# Patient Record
Sex: Female | Born: 1935 | Race: White | Hispanic: No | Marital: Married | Smoking: Never smoker
Health system: Southern US, Community
[De-identification: ages and names within clinical notes are randomized; demographics above are authoritative.]

## PROBLEM LIST (undated history)

## (undated) DIAGNOSIS — Z853 Personal history of malignant neoplasm of breast: Secondary | ICD-10-CM

## (undated) DIAGNOSIS — M1990 Unspecified osteoarthritis, unspecified site: Secondary | ICD-10-CM

## (undated) DIAGNOSIS — Z923 Personal history of irradiation: Secondary | ICD-10-CM

## (undated) DIAGNOSIS — C50919 Malignant neoplasm of unspecified site of unspecified female breast: Secondary | ICD-10-CM

## (undated) DIAGNOSIS — K529 Noninfective gastroenteritis and colitis, unspecified: Secondary | ICD-10-CM

## (undated) DIAGNOSIS — I10 Essential (primary) hypertension: Secondary | ICD-10-CM

## (undated) HISTORY — DX: Unspecified osteoarthritis, unspecified site: M19.90

## (undated) HISTORY — DX: Personal history of malignant neoplasm of breast: Z85.3

## (undated) HISTORY — DX: Essential (primary) hypertension: I10

## (undated) HISTORY — DX: Malignant neoplasm of unspecified site of unspecified female breast: C50.919

## (undated) HISTORY — PX: TONSILLECTOMY: SUR1361

## (undated) HISTORY — DX: Noninfective gastroenteritis and colitis, unspecified: K52.9

## (undated) HISTORY — PX: EYE SURGERY: SHX253

---

## 1999-01-11 DIAGNOSIS — C50919 Malignant neoplasm of unspecified site of unspecified female breast: Secondary | ICD-10-CM

## 1999-01-11 HISTORY — DX: Malignant neoplasm of unspecified site of unspecified female breast: C50.919

## 1999-01-11 HISTORY — PX: BREAST LUMPECTOMY: SHX2

## 1999-01-11 HISTORY — PX: BREAST EXCISIONAL BIOPSY: SUR124

## 1999-01-11 HISTORY — PX: BREAST BIOPSY: SHX20

## 2003-10-11 ENCOUNTER — Ambulatory Visit: Payer: Self-pay | Admitting: Oncology

## 2003-11-16 ENCOUNTER — Other Ambulatory Visit: Payer: Self-pay

## 2003-11-16 ENCOUNTER — Emergency Department: Payer: Self-pay | Admitting: Emergency Medicine

## 2004-04-02 ENCOUNTER — Ambulatory Visit: Payer: Self-pay | Admitting: Oncology

## 2004-04-10 ENCOUNTER — Ambulatory Visit: Payer: Self-pay | Admitting: Oncology

## 2004-04-16 ENCOUNTER — Ambulatory Visit: Payer: Self-pay | Admitting: General Surgery

## 2004-09-28 ENCOUNTER — Ambulatory Visit: Payer: Self-pay | Admitting: Unknown Physician Specialty

## 2004-10-14 ENCOUNTER — Ambulatory Visit: Payer: Self-pay | Admitting: Oncology

## 2004-11-10 ENCOUNTER — Ambulatory Visit: Payer: Self-pay | Admitting: Oncology

## 2005-02-14 ENCOUNTER — Ambulatory Visit: Payer: Self-pay | Admitting: Unknown Physician Specialty

## 2005-02-14 ENCOUNTER — Other Ambulatory Visit: Payer: Self-pay

## 2005-04-11 ENCOUNTER — Ambulatory Visit: Payer: Self-pay | Admitting: Oncology

## 2005-05-11 ENCOUNTER — Ambulatory Visit: Payer: Self-pay | Admitting: General Surgery

## 2005-05-12 ENCOUNTER — Ambulatory Visit: Payer: Self-pay | Admitting: Oncology

## 2005-10-24 ENCOUNTER — Ambulatory Visit: Payer: Self-pay | Admitting: Oncology

## 2005-11-10 ENCOUNTER — Ambulatory Visit: Payer: Self-pay | Admitting: Oncology

## 2005-12-15 ENCOUNTER — Ambulatory Visit: Payer: Self-pay | Admitting: General Practice

## 2006-06-12 ENCOUNTER — Ambulatory Visit: Payer: Self-pay | Admitting: General Surgery

## 2006-10-11 ENCOUNTER — Ambulatory Visit: Payer: Self-pay | Admitting: Oncology

## 2006-10-19 ENCOUNTER — Ambulatory Visit: Payer: Self-pay | Admitting: Oncology

## 2006-11-11 ENCOUNTER — Ambulatory Visit: Payer: Self-pay | Admitting: Oncology

## 2007-06-11 ENCOUNTER — Ambulatory Visit: Payer: Self-pay | Admitting: Oncology

## 2007-06-13 ENCOUNTER — Ambulatory Visit: Payer: Self-pay | Admitting: General Surgery

## 2007-11-11 ENCOUNTER — Ambulatory Visit: Payer: Self-pay | Admitting: Oncology

## 2007-11-14 ENCOUNTER — Ambulatory Visit: Payer: Self-pay | Admitting: Oncology

## 2007-12-11 ENCOUNTER — Ambulatory Visit: Payer: Self-pay | Admitting: Oncology

## 2008-01-11 HISTORY — PX: BREAST BIOPSY: SHX20

## 2008-06-24 ENCOUNTER — Ambulatory Visit: Payer: Self-pay | Admitting: General Surgery

## 2008-10-07 ENCOUNTER — Ambulatory Visit: Payer: Self-pay | Admitting: Ophthalmology

## 2008-10-13 ENCOUNTER — Ambulatory Visit: Payer: Self-pay | Admitting: Ophthalmology

## 2008-11-10 ENCOUNTER — Ambulatory Visit: Payer: Self-pay | Admitting: Oncology

## 2008-11-19 ENCOUNTER — Ambulatory Visit: Payer: Self-pay | Admitting: Oncology

## 2008-12-10 ENCOUNTER — Ambulatory Visit: Payer: Self-pay | Admitting: Oncology

## 2009-04-08 ENCOUNTER — Ambulatory Visit: Payer: Self-pay | Admitting: Unknown Physician Specialty

## 2009-04-15 ENCOUNTER — Ambulatory Visit: Payer: Self-pay | Admitting: Unknown Physician Specialty

## 2009-06-30 ENCOUNTER — Ambulatory Visit: Payer: Self-pay | Admitting: General Surgery

## 2010-01-10 HISTORY — PX: COLONOSCOPY: SHX174

## 2010-04-06 ENCOUNTER — Ambulatory Visit: Payer: Self-pay | Admitting: Unknown Physician Specialty

## 2010-07-12 ENCOUNTER — Ambulatory Visit: Payer: Self-pay | Admitting: General Surgery

## 2011-07-19 ENCOUNTER — Ambulatory Visit: Payer: Self-pay | Admitting: General Surgery

## 2011-09-04 ENCOUNTER — Observation Stay: Payer: Self-pay | Admitting: Internal Medicine

## 2011-09-04 LAB — COMPREHENSIVE METABOLIC PANEL
Albumin: 2.5 g/dL — ABNORMAL LOW (ref 3.4–5.0)
Alkaline Phosphatase: 55 U/L (ref 50–136)
Calcium, Total: 6.1 mg/dL — CL (ref 8.5–10.1)
Co2: 20 mmol/L — ABNORMAL LOW (ref 21–32)
EGFR (Non-African Amer.): >60
Glucose: 92 mg/dL (ref 65–99)
Osmolality: 299 (ref 275–301)
SGOT(AST): 14 U/L — ABNORMAL LOW (ref 15–37)
SGPT (ALT): 16 U/L (ref 12–78)

## 2011-09-04 LAB — URINALYSIS, COMPLETE
Bilirubin,UR: NEGATIVE
Blood: NEGATIVE
Glucose,UR: NEGATIVE mg/dL (ref 0–75)
Nitrite: POSITIVE
Ph: 5 (ref 4.5–8.0)
Specific Gravity: 1.01 (ref 1.003–1.030)
Squamous Epithelial: 3
Transitional Epi: 1
WBC UR: 24 /HPF (ref 0–5)

## 2011-09-04 LAB — CBC
Platelet: 152 10*3/uL (ref 150–440)
RBC: 3.18 10*6/uL — ABNORMAL LOW (ref 3.80–5.20)
RDW: 13.9 % (ref 11.5–14.5)

## 2011-09-04 LAB — CK TOTAL AND CKMB (NOT AT ARMC)
CK, Total: 34 U/L (ref 21–215)
CK-MB: 0.7 ng/mL (ref 0.5–3.6)

## 2011-09-04 LAB — TROPONIN I: Troponin-I: <0.02 ng/mL

## 2011-09-05 LAB — BASIC METABOLIC PANEL
BUN: 19 mg/dL — ABNORMAL HIGH (ref 7–18)
Chloride: 110 mmol/L — ABNORMAL HIGH (ref 98–107)
Co2: 23 mmol/L (ref 21–32)
Creatinine: 0.81 mg/dL (ref 0.60–1.30)
Potassium: 4.6 mmol/L (ref 3.5–5.1)

## 2012-07-23 ENCOUNTER — Ambulatory Visit: Payer: Self-pay | Admitting: General Surgery

## 2012-07-23 ENCOUNTER — Encounter: Payer: Self-pay | Admitting: General Surgery

## 2012-08-09 ENCOUNTER — Ambulatory Visit (INDEPENDENT_AMBULATORY_CARE_PROVIDER_SITE_OTHER): Payer: Medicare PPO | Admitting: General Surgery

## 2012-08-09 ENCOUNTER — Encounter: Payer: Self-pay | Admitting: General Surgery

## 2012-08-09 VITALS — BP 122/68 | HR 68 | Resp 13 | Ht 66.0 in | Wt 184.0 lb

## 2012-08-09 DIAGNOSIS — Z853 Personal history of malignant neoplasm of breast: Secondary | ICD-10-CM

## 2012-08-09 NOTE — Progress Notes (Signed)
Patient ID: Olivia [name], female   DOB: July 25, 1931, 79 y.o.   MRN: [id number]  Chief Complaint  Patient presents with  . Follow-up    mammogram    HPI Olivia [name] is a 79 y.o. female  Here for follow up bilateral diagnostic mammogram done at [facility]  On 07/23/12. Patient has history of left breast cancer that was treated with lumpectomy and radiation in 2001. She completed 5 years of Arimidex.  Patient does preform self breast exams and does get routine mammograms done. No new breast issues. HPI  Past Medical History  Diagnosis Date  . Hypertension   . Personal history of malignant neoplasm of breast   . Breast cancer 2001    left, invasive ductal carcinoma  . Arthritis     Past Surgical History  Procedure Laterality Date  . Colonoscopy  2012    Dr. Mechele Collin  . Tonsillectomy  as child  . Eye surgery  2006, 2010    cataract  . Breast lumpectomy Left 2001    Family History  Problem Relation Age of Onset  . Diabetes Father   . Cancer Mother     Social History History  Substance Use Topics  . Smoking status: Never Smoker   . Smokeless tobacco: Never Used  . Alcohol Use: No    No Known Allergies  Current Outpatient Prescriptions  Medication Sig Dispense Refill  . acetaminophen (TYLENOL) 500 MG tablet Take 500 mg by mouth every 6 (six) hours as needed for pain.      Marland Kitchen atorvastatin (LIPITOR) 10 MG tablet Take 10 mg by mouth daily.      . Calcium Carbonate-Vitamin D (CALCIUM + D PO) Take 1 tablet by mouth 2 (two) times daily.      . furosemide (LASIX) 20 MG tablet Take 20 mg by mouth as needed.      . ramipril (ALTACE) 5 MG capsule Take 5 mg by mouth daily.       No current facility-administered medications for this visit.    Review of Systems Review of Systems  Constitutional: Negative.   Respiratory: Negative.   Cardiovascular: Negative.     Blood pressure 122/68, pulse 68, resp. rate 13, height 5\' 6"  (1.676 m), weight 184 lb (83.462 kg).  Physical  Exam Physical Exam  Constitutional: She is oriented to person, place, and time. She appears well-developed and well-nourished.  Eyes: Conjunctivae are normal.  Neck: Neck supple. No thyromegaly present.  Cardiovascular: Normal rate and regular rhythm.   Pulses:      Dorsalis pedis pulses are 2+ on the right side, and 2+ on the left side.       Posterior tibial pulses are 2+ on the right side, and 2+ on the left side.  Pulmonary/Chest: Effort normal and breath sounds normal. Right breast exhibits no inverted nipple, no mass, no nipple discharge, no skin change and no tenderness. Left breast exhibits no inverted nipple, no mass, no nipple discharge, no skin change and no tenderness.  Abdominal: Soft. There is no hepatosplenomegaly. There is no tenderness.  Musculoskeletal: She exhibits no edema.  Lymphadenopathy:    She has no cervical adenopathy.    She has no axillary adenopathy.  Neurological: She is alert and oriented to person, place, and time.  Skin: Skin is warm and dry.  Well healed lumpectomy site upper outer quadrant left breast  Data Reviewed Mammogram reviewed and stable  Assessment    Stable exam    Plan  One year bilateral diagnostic mammogram CA 27.29 blood test can be drawn with routine labs from Dr Bertha Stakes, [place] 08/09/2012, 9:45 AM

## 2012-08-09 NOTE — Patient Instructions (Addendum)
Continue self breast exams. Call office for any new breast issues or concerns. 

## 2013-08-14 ENCOUNTER — Ambulatory Visit: Payer: Self-pay | Admitting: General Surgery

## 2013-08-14 ENCOUNTER — Encounter: Payer: Self-pay | Admitting: General Surgery

## 2013-08-22 ENCOUNTER — Ambulatory Visit: Payer: Medicare PPO | Admitting: General Surgery

## 2013-08-26 ENCOUNTER — Ambulatory Visit (INDEPENDENT_AMBULATORY_CARE_PROVIDER_SITE_OTHER): Payer: Medicare PPO | Admitting: General Surgery

## 2013-08-26 ENCOUNTER — Encounter: Payer: Self-pay | Admitting: General Surgery

## 2013-08-26 VITALS — BP 130/74 | HR 70 | Resp 14 | Ht 66.0 in | Wt 179.0 lb

## 2013-08-26 DIAGNOSIS — Z853 Personal history of malignant neoplasm of breast: Secondary | ICD-10-CM

## 2013-08-26 NOTE — Progress Notes (Signed)
Patient ID: MAIGEN [name], female   DOB: 1931-11-16, 80 y.o.   MRN: [id number]  Chief Complaint  Patient presents with  . Follow-up    mammogram    HPI Olivia [name] is a 80 y.o. female who presents for a breast cancer follow up. The most recent mammogram was done on 08/14/13 . Patient does perform regular self breast checks and gets regular mammograms done.Patient has history of left breast cancer that was treated with lumpectomy and radiation in 2001.    HPI  Past Medical History  Diagnosis Date  . Hypertension   . Personal history of malignant neoplasm of breast   . Arthritis   . Breast cancer 2001    left, invasive ductal carcinoma T1 N0 ER/PR positive    Past Surgical History  Procedure Laterality Date  . Colonoscopy  2012    Dr. Vira Agar  . Tonsillectomy  as child  . Eye surgery  2006, 2010    cataract  . Breast lumpectomy Left 2001    Family History  Problem Relation Age of Onset  . Diabetes Father   . Cancer Mother     Social History History  Substance Use Topics  . Smoking status: Never Smoker   . Smokeless tobacco: Never Used  . Alcohol Use: No    No Known Allergies  Current Outpatient Prescriptions  Medication Sig Dispense Refill  . acetaminophen (TYLENOL) 500 MG tablet Take 500 mg by mouth every 6 (six) hours as needed for pain.      Marland Kitchen atorvastatin (LIPITOR) 10 MG tablet Take 10 mg by mouth daily.      . Calcium Carbonate-Vitamin D (CALCIUM + D PO) Take 1 tablet by mouth 2 (two) times daily.      . furosemide (LASIX) 20 MG tablet Take 20 mg by mouth as needed.      . ramipril (ALTACE) 5 MG capsule Take 5 mg by mouth daily.       No current facility-administered medications for this visit.    Review of Systems Review of Systems  Constitutional: Negative.   Respiratory: Negative.   Cardiovascular: Negative.     Blood pressure 130/74, pulse 70, resp. rate 14, height 5\' 6"  (1.676 m), weight 179 lb (81.194 kg).  Physical Exam Physical  Exam  Constitutional: She is oriented to person, place, and time. She appears well-developed and well-nourished.  Eyes: Conjunctivae are normal. No scleral icterus.  Neck: Neck supple.  Cardiovascular: Normal rate, regular rhythm and normal heart sounds.   Pulmonary/Chest: Effort normal and breath sounds normal. Right breast exhibits no inverted nipple, no mass, no nipple discharge, no skin change and no tenderness. Left breast exhibits no inverted nipple, no mass, no nipple discharge, no skin change and no tenderness.  Abdominal: Soft. There is no tenderness.  Lymphadenopathy:    She has no cervical adenopathy.    She has no axillary adenopathy.  Neurological: She is alert and oriented to person, place, and time.    Data Reviewed Mammogram reviewed-stable  Assessment  Stable exam. Pt is 14 yrs post left breast lumpectomy and radiation.     Plan   Patient will be asked to return to the office in one year with a bilateral screening mammogram. CA 27-29 being monitored along with her regular labs.        Loise Esguerra G 08/28/2013, 6:21 AM

## 2013-08-26 NOTE — Patient Instructions (Signed)
Continue self breast exams. Call office for any new breast issues or concerns. 

## 2013-08-28 ENCOUNTER — Encounter: Payer: Self-pay | Admitting: General Surgery

## 2013-11-11 ENCOUNTER — Encounter: Payer: Self-pay | Admitting: General Surgery

## 2014-02-28 ENCOUNTER — Inpatient Hospital Stay: Payer: Self-pay | Admitting: Internal Medicine

## 2014-03-11 DIAGNOSIS — K529 Noninfective gastroenteritis and colitis, unspecified: Secondary | ICD-10-CM

## 2014-03-11 HISTORY — DX: Noninfective gastroenteritis and colitis, unspecified: K52.9

## 2014-04-29 NOTE — Discharge Summary (Signed)
PATIENT NAME:  Olivia [name], Olivia [name] MR#:  [id number] DATE OF BIRTH:  [birth year]  DATE OF ADMISSION:  09/04/2011 DATE OF DISCHARGE:  09/05/2011  DISCHARGE DIAGNOSIS: Vasovagal syncope.  HISTORY AND PRESENTATION: This 81 year old female with history of hypertension, chronic constipation since childhood, and history of allergies came to the emergency room after having a syncopal episode. As per her, she was constipated last this morning; she has been constipated for the last several days more than usual and she took two Bisacodyl tablets in the morning and she took her dog out for a walk. She started feeling unwell and desire to go to the bathroom. So she came home, suddenly started feeling very weak and clammy. She sat down. She had an urge to have a bowel movement so she walked towards the bathroom, but before she reached the bathroom she had a syncopal episode. Her husband as there. He held her and so she did not have any trauma, but as per him she was not very responsive for 2 to 3 minutes and not answering his questions. So she was brought to the emergency room. In the ER, the patient was found to have electrolyte abnormalities and hypernatremia, hyperchloremia, and low potassium. She was admitted for dehydration and electrolyte imbalance.   HOSPITAL COURSE:  1. During the hospital stay, the patient was given IV fluid and she had three bowel movements in the hospital. She was rehydrated well and repeated electrolyte level was fine. She remained without any further episode of syncope, feeling any palpitations, or any chest pain. So the diagnosis was most likely vasovagal which was reemphasized and she was discharged in the evening as she was perfectly feeling normal. Her dehydration and electrolyte imbalance was corrected after replacement. Hypokalemia was thought maybe possibly due to diuretics and diarrhea, but it resolved after replacement. She was also using Lasix for her ankle swelling and that might  have contributed to hypokalemia and dehydration, but we advised her not to use Lasix for ankle swelling; instead of that use elastic stockings.  2. Hypertension. We advised her to continue Altace. 3. WBCs in the urine were found during the workup, but she did not have any symptoms so we did not give any treatment for that.   DISCHARGE CONDITION: Good.  CODE STATUS: FULL CODE.   DISCHARGE MEDICATIONS: 1. Altace 5 mg one capsule once a day. 2. Calcium 600 mg orally twice a day. 3. Centrum Silver one tablet once a day. 4. Nasonex 50 mcg inhalation spray in each nostril as needed for allergies and sinus. 5. Aspirin, enteric-coated, 81 mg once a day. 6. Tylenol Arthritis 650 mg oral tablet once a day as needed.  NOTE: We advised her to stop taking furosemide.  OTHER INSTRUCTIONS ON DISCHARGE: Home oxygen - none. Home health - none. Diet - low fat, low cholesterol. Diet consistent - regular. Activity limitations - none. Return to work in one week. Time frame for followup - one to two weeks with Dr. Benita Stabile who is her primary care physician.  ____________________________ Ceasar Lund. Anselm Jungling, MD vgv:slb D: 09/09/2011 16:50:57 ET T: 09/10/2011 14:33:13 ET JOB#: [id number]  cc: Ceasar Lund. Anselm Jungling, MD, <Dictator> Leona Carry. Hall Busing, MD Vaughan Basta MD ELECTRONICALLY SIGNED 09/28/2011 14:36

## 2014-04-29 NOTE — H&P (Signed)
PATIENT NAME:  Olivia [name], Olivia [name] MR#:  [id number] DATE OF BIRTH:  Dec 18, 1931  DATE OF ADMISSION:  09/04/2011  PRIMARY CARE PHYSICIAN:  Dr. Benita Stabile    CHIEF COMPLAINT: Syncopal episode.   HISTORY OF PRESENT ILLNESS:  Olivia [name] is a pleasant 81 year old Caucasian female who has a history of hypertension, chronic constipation since childhood, and history of allergies who comes to the Emergency Room after she had a syncopal episode. Per the patient she was constipated last this morning; she has been constipated for the last several days more than usual and took two bisacodyl tablets this morning. She took her dog out for a walk, started feeling "unwell" and decided to return home. She came home and all of a sudden started feeling very weak, became clammy and sat down.  She had the urge to have a bowel movement. By the time she could reach the bathroom with the help of her husband, she had an accident and thereafter had a syncopal episode. She did not have any trauma and did not fall since her husband was holding her. The patient felt very weak and then had a few more bowel movements thereafter. She was brought to the Emergency Room by her family members.  In the ER the patient was found to have electrolyte abnormalities with hypernatremia, hyperchloremia, and low potassium. She is being admitted for dehydration and electrolyte imbalances along with symptoms of syncope, suspected vasovagal.   PAST MEDICAL HISTORY:  1. Arthritis.  2. Hypertension.  3. Hypercholesterolemia.  4. Breast cancer status post lumpectomy.   MEDICATIONS:  1. Altace 5 mg daily.  2. Lasix 10 mg daily for ankle edema.  3. Calcium with vitamin D 600 b.i.d.  4. Aspirin 81 mg daily.  5. Allegra 180 mg daily p.r.n. 6. Aleve 1 tablet daily.   FAMILY HISTORY: Positive for hypertension.   SOCIAL HISTORY: Married, lives with her husband at home. Nonalcoholic. Nonsmoker.   REVIEW OF SYSTEMS:  CONSTITUTIONAL: No fever.  Positive for fatigue, weakness. EYES: No blurred or double vision or glaucoma. ENT: No tinnitus, ear pain, or hearing loss.  RESPIRATORY: No cough, wheeze, hemoptysis, or chronic obstructive pulmonary disease.  CARDIOVASCULAR: No chest pain, orthopnea, edema, or arrhythmia.  GI: No nausea or vomiting. Positive for diarrhea and history of constipation. GU: No dysuria or hematuria.  ENDOCRINE: No polyuria, nocturia, or thyroid problems. No heat or cold intolerance.  HEMATOLOGIC: No anemia or easy bruising.  SKIN: No acne or rash. MUSCULOSKELETAL: Positive for arthritis. NEUROLOGIC: No cerebrovascular accident or transient ischemic attack.  PSYCH: No anxiety or depression. All other systems reviewed and negative.   PHYSICAL EXAMINATION:  GENERAL: The patient is awake, alert, oriented times three, not in acute distress.  VITAL SIGNS:  Pulse is 75, blood pressure 151/73, saturations 94% on room air.   HEENT: Atraumatic, normocephalic. Pupils are equal, round, and reactive to light and accommodation. Extraocular movements intact. Oral mucosa is moist.   NECK: Supple. No JVD. No carotid bruit.   LUNGS: Clear to auscultation bilaterally. No rales, rhonchi, respiratory distress, or labored breathing.   HEART: Both the heart sounds are normal. Rate and rhythm are regular. PMI not lateralized. Chest is nontender.   EXTREMITIES: Good pedal pulses, good femoral pulses. No lower extremity edema.   ABDOMEN: Soft, benign, and nontender. No organomegaly. Positive bowel sounds.   NEUROLOGIC: Grossly intact cranial nerves II through XII. No motor or sensory deficits.   PSYCH: The patient is awake, alert, and oriented times three.  LABORATORY, DIAGNOSTIC, AND RADIOLOGICAL DATA: EKG shows normal sinus rhythm. CBC: Hemoglobin and hematocrit 9.6 and 27.7. White count 4.8, platelet count 152. Glucose 92, BUN is 16, creatinine 0.79, sodium 150, potassium 2.6, chloride 118, bicarbonate 20, calcium 6.1, bilirubin  total 0.2, alkaline phosphatase 55, SGOT 14, total protein 4.6, albumin 2.5. Anion gap 12. Cardiac enzymes, first set negative.   ASSESSMENT: 81 year old Olivia [name] with history of hypertension and chronic constipation who comes to the Emergency Room with:  1. Syncopal episode: Appears vasovagal due to diarrhea and dehydration. The patient has chronic constipation, took 2 tablets of bisacodyl today and went for a walk and then had an urge to have a bowel movement. She went home, became clammy, and had a syncopal episode while going to the bathroom with the help of her family. She had diarrheal stools times three since then. She is currently neurologically intact. We will admit the patient to the medical floor. Regular diet.  IV fluids. We will replace potassium with IV and p.o. Check metabolic panel in the morning.   2. Dehydration with hypernatremia and hyperchloremia: Continue IV fluids.  3. Hypokalemia: Likely due to diuretics and diarrhea. The patient was advised not to take Lasix for ankle edema. She does not have much edema and was hence advised to discontinue it and use stockings instead.  4. Hypertension: Continue Altace.  5. Further work-up according to the patient's clinical course. Hospital admission plan was discussed with patient and the family members who are present in the ER.   TIME SPENT: 50 minutes.  ____________________________ Hart Rochester Posey Pronto, MD sap:bjt D: 09/04/2011 21:30:42 ET T: 09/05/2011 08:26:01 ET JOB#: [id number]  cc: Margurite Duffy A. Posey Pronto, MD, <Dictator> Leona Carry. Hall Busing, MD Ilda Basset MD ELECTRONICALLY SIGNED 09/13/2011 15:44

## 2014-05-11 NOTE — H&P (Signed)
PATIENT NAME:  Olivia [name], AMASON MR#:  [id number] DATE OF BIRTH:  08-24-1931  DATE OF ADMISSION:  02/28/2014  PRIMARY CARE PHYSICIAN: Sharlet Salina C. Hall Busing, MD  REFERRING EMERGENCY ROOM PHYSICIAN: Baird Cancer. Quale, MD   CHIEF COMPLAINT: Blood in the stool.   HISTORY OF PRESENTING ILLNESS: An 81 year old female who has a past history of arthritis, hypertension, hypercholesterolemia, and breast cancer, lives with husband, takes aspirin every day and Tylenol at sleeping time, has some hemorrhoids and minor bleeding or spot of blood from that in the past. Yesterday, around 4:00 a.m. in the morning, she noticed a lot of blood in her stool. Spoke to her husband. They were a little bit concerned about that, but in the daytime she also continued to feel a little weak and in the afternoon she again had an episode. The husband also watched that. There was a lot of blood and so concerned with that they decided to come to the Emergency Room. In the ER hemoglobin was stable, but because of this history and her white cell count was high, ER did a CAT scan of the abdomen and found there was some colitis, so given to the hospitalist team for further management. On my questioning to patient, she confirms there was no fever or chills, but she had some nausea without any vomiting and mild discomfort in the abdomen. She had a colonoscopy done 4-5 years ago with Dr. Vira Agar, which did not show any anything.   REVIEW OF SYSTEMS:  CONSTITUTIONAL: Negative for fever, fatigue, weakness, pain, or weight loss.  EYES: No blurring, double vision, discharge, or redness.  EARS, NOSE, THROAT: No tinnitus, ear pain, or hearing loss.  RESPIRATORY: No cough, wheezing, hemoptysis, or shortness of breath.  CARDIOVASCULAR: No chest pain, orthopnea, edema, arrhythmia, or palpitations.  GASTROINTESTINAL: The patient had some nausea. No vomiting. No diarrhea. Some blood in the stool.  GENITOURINARY: No dysuria, hematuria, or increased frequency.   ENDOCRINE: No heat or cold intolerance. No excessive sweating.  SKIN: No acne, rashes, or lesions.  MUSCULOSKELETAL: No pain or swelling in the joints.  NEUROLOGICAL: No numbness, weakness, tremor, or vertigo.  PSYCHIATRIC: No anxiety, insomnia, or bipolar disorder.   PAST MEDICAL HISTORY:  1.  Arthritis.  2.  Hypertension.  3.  Hypercholesterolemia.  4.  Breast cancer, status post lumpectomy.   SOCIAL HISTORY: She is married, lives with husband. No alcohol. Nonsmoker.   FAMILY HISTORY: Father had diabetes. Sister had hypertension and valvular heart disease.   HOME MEDICATIONS:  1.  Zyrtec 10 mg oral tablet once a day.  2.  Tylenol 650 mg oral once a day.  3.  Ramipril 5 mg oral capsule once a day.  4.  Nasonex 50 mcg inhalation 2 sprays once a day.  5.  Multivitamin once a day.  6.  Calcium plus vitamin D 2 times a day.  7.  Atorvastatin 10 mg once a day.  8.  Aspirin 81 mg once a day.  9.  Aleve 220 mg tablet once a day in the morning as needed for pain, but she said the last 4 to 5 months she did not require to take Aleve.   PHYSICAL EXAMINATION:  VITAL SIGNS: In the ER, temperature 98, pulse 92, respirations 18, blood pressure 145/65, pulse oximetry is 96% on room air.  GENERAL: The patient is fully alert and oriented to time, place, and person. Does not appear in any acute cardiac distress.  HEENT: Head and neck atraumatic. Conjunctivae are pink.  Oral mucosa moist.  NECK: Supple. No JVD. Thyroid nontender.  RESPIRATORY: Bilateral equal and clear air entry. No wheezing or crepitation.  CARDIOVASCULAR: S1, S2 present, regular. No tenderness on local palpation of the chest. No murmur.  ABDOMEN: Soft, nontender. Bowel sounds present. No organomegaly.  SKIN: No acne, rashes, or lesions.  LEGS: No edema.  JOINTS: No swelling or tenderness.  NEUROLOGICAL: No numbness, weakness. Power 5/5. Follows commands. No tremor or rigidity. Sensation is intact.  PSYCHIATRIC: Does not  appear in acute psychiatric illness at this time.   IMPORTANT LABORATORY DATA: Glucose 115, BUN 26, creatinine 0.88, sodium 139, potassium 4.1, chloride 109, CO2 is 21. Lipase 212. Total protein 7.1, albumin 3.5, bilirubin 0.8, alkaline phosphate 80, SGOT 30, and SGPT is 30. WBC 16.4, hemoglobin is 13.8, platelet count 207,000, MCV 87. Urinalysis is mild positive with 10 WBCs, 1+ leukocyte esterase, and positive nitrite.   CT scan of the abdomen with contrast is done. Shows nonspecific colitis involving the left colon and left transverse colon through the sigmoid colon. May be infectious or inflammatory. No evidence of abscess. No other acute findings.   ASSESSMENT AND PLAN: An 81 year old female who has a past history of arthritis, hypertension, hypercholesterolemia, and breast cancer; takes aspirin and Tylenol every day, came to hospital with blood in the stool, 2-3 episodes. Hemoglobin is stable. Found colitis on CT abdomen.  1.  Acute colitis: This is evident by CT scan and elevated white cell count with blood in the stool. We will admit to hospital on medical floor, give Cipro and Flagyl, follow serial hemoglobin, and call gastroenterology consult.  2.  Gastrointestinal bleed: This is secondary to colitis likely. We will call a gastroenterology consult for further evaluation. Hemoglobin is stable currently. No need for transfusion. Continue monitoring.  3.  Hypertension: Continue ramipril.   4.  Hyperlipidemia: Continue atorvastatin.  5.  Deep vein thrombosis prophylaxis: We will hold her aspirin and Tylenol because of gastrointestinal bleed issue and we will not give any anticoagulation, just continue with sequential compression devices.  CODE STATUS: FULL CODE.   TOTAL TIME SPENT ON THIS ADMISSION: 50 minutes.     ____________________________ Ceasar Lund Anselm Jungling, MD vgv:ts D: 03/01/2014 00:58:00 ET T: 03/01/2014 01:42:06 ET JOB#: [id number]  cc: Ceasar Lund. Anselm Jungling, MD,  <Dictator> Leona Carry. Hall Busing, MD  Vaughan Basta MD ELECTRONICALLY SIGNED 03/24/2014 12:51

## 2014-05-11 NOTE — Discharge Summary (Signed)
PATIENT NAME:  Olivia [name], Olivia [name] MR#:  [id number] DATE OF BIRTH:  02-12-1931  DATE OF ADMISSION:  02/28/2014 DATE OF DISCHARGE:  03/01/2014  DISCHARGE DIAGNOSES:  1. Acute colitis.  2. Gastrointestinal bleed.  3. Hypertension.  4. Hyperlipidemia.   CONDITION ON DISCHARGE: Stable.   MEDICATIONS ON DISCHARGE:  1. Calcium 600 plus vitamin D 2 times a day.  2. Nasonex 50 mcg inhalation spray once a day.  3. Atorvastatin 10 mg once a day.  4. Ramipril 5 mg once a day.  5. Multivitamin once a day.  6. Acetaminophen 325-mg tablet, take 2 tablets 4 times a day as needed for pain and fever.  7. Flagyl 500 mg oral every 8 hours for 7 days.  8. Ciprofloxacin 500-mg oral tablet every 12 hours for 7 days.  Discharged on low sodium and mechanical soft with low residue diet.  Advised to follow with primary care physician, Dr. Benita Stabile, in 1 to 2 weeks.   HISTORY OF PRESENTING ILLNESS: An 82 year old female who has a past history of arthritis, hypertension, hypercholesterolemia and breast cancer, who came to the hospital because for 1 day she is having blood in her stool. CT scan of the abdomen was done, which found there is some colitis and so she was admitted for further management. Her hemoglobin was stable on admission.   HOSPITAL COURSE AND STAY:  1. Acute colitis. She was started on ciprofloxacin and Flagyl for that. Serial hemoglobins were checked, which were stable. Gastroenterology consult was done and Dr. Candace Cruise suggested low residue diet and to discharge her home, as there were no more diarrhea episodes in the hospital in almost 24 hours.  2. Gastrointestinal bleed. This was secondary to colitis. GI consult was done. Hemoglobin was stable, so we advised to discharge home and get low-residue diet for 1 week and follow with PMD.  3. Hypertension. We continued ramipril and it remained stable.  4. Hyperlipidemia. Continued atorvastatin. She was advised not to take aspirin or any  over-the-counter pain medication for the next few days and discharged home.  [place]: Lupita Dawn. Candace Cruise, MD, for GI.  [place]: On admission, WBC 16.4, hemoglobin 13.8, platelet count 207,000, MCV 87. Glucose 115, creatinine 0.88, sodium 139, potassium 4.1, chloride 109, and CO2 of 21.  CT scan of abdomen and pelvis with contrast on admission shows nonspecific colitis involving the left colon from left transverse to sigmoid colon. No evidence of any abscess.  Blood culture was negative. Urinalysis was mild positive, 15 WBCs and 1+ leukocyte esterase. The next day on followup, hemoglobin was 12.1. Creatinine was 0.87.  TOTAL TIME SPENT ON THIS DISCHARGE: 35 minutes.   ____________________________ Ceasar Lund Anselm Jungling, MD vgv:jh D: 03/05/2014 08:23:49 ET T: 03/05/2014 11:32:28 ET JOB#: [id number]  cc: Ceasar Lund. Anselm Jungling, MD, <Dictator> Leona Carry. Hall Busing, MD Lupita Dawn. Candace Cruise, MD Vaughan Basta MD ELECTRONICALLY SIGNED 03/24/2014 12:51

## 2014-05-11 NOTE — Consult Note (Signed)
Pt seen and examined. Full consult to follow. Pt with acute rectal bleeding, diarrhea,  and abdominal pain. CT shows colitis. Known hx of colon polyps. Last colon done in 3/12 showed rectal polyp and hemorrhoids only. Currently, no abdominal pain and minimal rectal bleeding. Abdomen soft and nontender. Pt likely has infectious or ischemic colitis, which should resolve quickly on own or with Abx. Advance to low residue diet. If tolerated without significant cramping or bleeding, patient can be discharged on low residue diet x 1 week plus oral Abx for a week as well. thanks.  Electronic Signatures: Verdie Shire (MD)  (Signed on 20-Feb-16 12:00)  Authored  Last Updated: 20-Feb-16 12:00 by Verdie Shire (MD)

## 2014-05-11 NOTE — Consult Note (Signed)
PATIENT NAME:  Olivia [name], Olivia [name] MR#:  [id number] DATE OF BIRTH:  [birth year]  DATE OF CONSULTATION:  03/01/2014  REFERRING PHYSICIAN:   CONSULTING PHYSICIAN:  Lupita Dawn. Kade Rickels, MD  REASON FOR REFERRAL: Diarrhea, rectal bleeding, and abdominal pain.   DESCRIPTION: The patient is an 81 year old, white female, with known history of breast cancer, hypertension, hypercholesterolemia, who takes baby aspirin daily. She woke up the morning of February 18 with some blood in the stool and she was feeling slightly weak. Then, after, she started having more rectal bleeding. Therefore, the patient decided to come to the Emergency Room for further evaluation. Even though her hemoglobin was stable, her white count was elevated. They decided to do a CT scan of the abdomen. It showed some inflammation mostly in the left side of the colon. Therefore, the decision was made to admit the patient for further treatment and evaluation. The patient was already placed on antibiotics. I was asked to see the patient because of this colitis.   The patient had a history of colon polyps. Her last colonoscopy was done in March 2012, which showed a rectal polyp and hemorrhoids only. Fortunately for her, she has no abdominal pain now with minimal rectal bleeding, and the patient would like to go home later today, if possible.   REVIEW OF SYSTEMS: There are no fevers, chills or weight changes. There is no nausea or vomiting right now. Again, there is no abdominal pain, although she did have pain when she was admitted. The rest of the review of symptoms is negative.   PAST MEDICAL HISTORY: Notable for hypercholesterolemia. She has had breast cancer requiring lumpectomy. Other history includes hypertension and arthritis.   SOCIAL HISTORY: She denies alcohol and tobacco history.   FAMILY HISTORY: Notable for diabetes, hypertension, heart disease.   MEDICATIONS AT HOME: Include atorvastatin 10 mg daily, baby aspirin daily, Aleve as needed,  Zyrtec daily, Tylenol daily, ramipril 5 mg daily, Nasonex and multivitamins.   PHYSICAL EXAMINATION:  GENERAL: The patient appears to be in no acute distress.  VITAL SIGNS: She is afebrile. Vital signs are stable.  HEAD AND NECK: Within normal limits.  CARDIAC: Revealed regular rhythm and rate.  LUNGS: Clear bilaterally.  ABDOMEN: Showed normoactive bowel sounds. It is soft. It is nontender throughout, at this time. There is no hepatomegaly. She has good bowel sounds.  EXTREMITIES: No clubbing, cyanosis or edema.  NEUROLOGIC: Examination is intact.  SKIN: Negative.   LABORATORIES: Her white count is slightly elevated at 12.3. It was 16.4 on admission. Electrolytes are normal. Liver enzymes are normal.   Blood cultures are negative so far.   Urinalysis is negative.   CT scan showed mild thickening in the left transverse colon extending to the descending colon and into the sigmoid colon.   ASSESSMENT AND PLAN: This is a patient with acute colitis. This suggests either ischemic colitis or infectious colitis, both of which should resolve quickly with either rest or antibiotics for a long period. The patient appears to be doing well already. I recommend that we advance her diet and see how she can tolerate it. I wrote an order for a low residue diet. If she can tolerate it without significant cramping or bleeding, the patient can be discharged on a low residue diet for the next week plus/or antibiotics for a week, as well. The patient can follow up with [place] if she continues to have issues. If the symptoms do not resolve completely, then we may repeat the colonoscopy  to evaluate this area.   Thank you for the referral.    ____________________________ Lupita Dawn. Candace Cruise, MD pyo:JT D: 03/02/2014 08:21:11 ET T: 03/02/2014 10:31:59 ET JOB#: [id number]  cc: Lupita Dawn. Candace Cruise, MD, <Dictator> Lupita Dawn Nicki Gracy MD ELECTRONICALLY SIGNED 03/05/2014 10:30

## 2014-07-17 ENCOUNTER — Other Ambulatory Visit: Payer: Self-pay

## 2014-07-17 DIAGNOSIS — Z1231 Encounter for screening mammogram for malignant neoplasm of breast: Secondary | ICD-10-CM

## 2014-08-18 ENCOUNTER — Ambulatory Visit
Admission: RE | Admit: 2014-08-18 | Discharge: 2014-08-18 | Disposition: A | Discharge status: Home / Self Care | Payer: Medicare PPO | Source: Ambulatory Visit | Attending: General Surgery | Admitting: General Surgery

## 2014-08-18 ENCOUNTER — Other Ambulatory Visit: Payer: Self-pay | Admitting: General Surgery

## 2014-08-18 DIAGNOSIS — Z1231 Encounter for screening mammogram for malignant neoplasm of breast: Secondary | ICD-10-CM

## 2014-08-25 ENCOUNTER — Ambulatory Visit: Payer: Medicare PPO | Admitting: General Surgery

## 2014-08-27 ENCOUNTER — Encounter: Payer: Self-pay | Admitting: General Surgery

## 2014-08-27 ENCOUNTER — Ambulatory Visit (INDEPENDENT_AMBULATORY_CARE_PROVIDER_SITE_OTHER): Payer: Medicare PPO | Admitting: General Surgery

## 2014-08-27 VITALS — BP 110/62 | HR 74 | Resp 16 | Ht 65.0 in | Wt 183.0 lb

## 2014-08-27 DIAGNOSIS — Z853 Personal history of malignant neoplasm of breast: Secondary | ICD-10-CM | POA: Diagnosis not present

## 2014-08-27 NOTE — Patient Instructions (Signed)
Continue self breast exams. Call office for any new breast issues or concerns. 

## 2014-08-27 NOTE — Progress Notes (Signed)
Patient ID: Olivia [name], female   DOB: May 26, 1931, 81 y.o.   MRN: [id number]  Chief Complaint  Patient presents with  . Follow-up    mammogram    HPI Olivia [name] is a 81 y.o. female.  who presents for a breast cancer follow up. The most recent mammogram was done on 08-18-14.  Patient does perform regular self breast checks and gets regular mammograms done.  No new breast issues. She had an episode of colitis in March of this yr-resolved with antibiotics.  HPI  Past Medical History  Diagnosis Date  . Hypertension   . Personal history of malignant neoplasm of breast   . Arthritis   . 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 Colitis March 2016    Past Surgical History  Procedure Laterality Date  . Colonoscopy  2012    Dr. Vira Agar  . Tonsillectomy  as child  . Eye surgery  2006, 2010    cataract  . Breast lumpectomy Left 2001  . Breast biopsy Left 2001    +  . Breast biopsy Left 2010    stereo scar tissure 2010  . Breast excisional biopsy Left 2001    + Radation    Family History  Problem Relation Age of Onset  . Diabetes Father   . Cancer Mother   . Breast cancer Mother 69  . Breast cancer Sister 48  . Breast cancer Paternal Aunt 75    Social History Social History  Substance Use Topics  . Smoking status: Never Smoker   . Smokeless tobacco: Never Used  . Alcohol Use: No    No Known Allergies  Current Outpatient Prescriptions  Medication Sig Dispense Refill  . Multiple Vitamin (MULTIVITAMIN) capsule Take 1 capsule by mouth daily.    Marland Kitchen acetaminophen (TYLENOL) 500 MG tablet Take 500 mg by mouth every 6 (six) hours as needed for pain.    Marland Kitchen atorvastatin (LIPITOR) 10 MG tablet Take 10 mg by mouth daily.    . Calcium Carbonate-Vitamin D (CALCIUM + D PO) Take 1 tablet by mouth 2 (two) times daily.    . furosemide (LASIX) 20 MG tablet Take 20 mg by mouth as needed.    . ramipril (ALTACE) 5 MG capsule Take 5 mg by  mouth daily.     No current facility-administered medications for this visit.    Review of Systems Review of Systems  Constitutional: Negative.   Respiratory: Negative.   Cardiovascular: Negative.     Blood pressure 110/62, pulse 74, resp. rate 16, height 5\' 5"  (1.651 m), weight 183 lb (83.008 kg).  Physical Exam Physical Exam  Constitutional: She is oriented to person, place, and time. She appears well-developed and well-nourished.  HENT:  Mouth/Throat: Oropharynx is clear and moist.  Eyes: Conjunctivae are normal. No scleral icterus.  Neck: Neck supple.  Cardiovascular: Normal rate, regular rhythm and normal heart sounds.   Pulmonary/Chest: Effort normal and breath sounds normal. Right breast exhibits no inverted nipple, no mass, no nipple discharge, no skin change and no tenderness. Left breast exhibits no inverted nipple, no mass, no nipple discharge, no skin change and no tenderness.  Abdominal: Soft. Normal appearance. There is no hepatomegaly. There is no tenderness.  Lymphadenopathy:    She has no cervical adenopathy.    She has no axillary adenopathy.  Neurological: She is alert and oriented to person, place, and time.  Skin: Skin is warm and dry.  Psychiatric:  Her behavior is normal.    Data Reviewed Mammogram reviewed and stable.   Assessment    Stable exam. Pt is 15 yrs post left breast lumpectomy and radiation.     Plan    Patient will be asked to return to the office in one year with a bilateral screening mammogram.       Lindwood Mogel G 08/27/2014, 10:12 AM

## 2014-10-14 DIAGNOSIS — N812 Incomplete uterovaginal prolapse: Secondary | ICD-10-CM | POA: Insufficient documentation

## 2014-11-10 DIAGNOSIS — R339 Retention of urine, unspecified: Secondary | ICD-10-CM | POA: Insufficient documentation

## 2015-01-20 DIAGNOSIS — N393 Stress incontinence (female) (male): Secondary | ICD-10-CM | POA: Insufficient documentation

## 2015-06-12 ENCOUNTER — Other Ambulatory Visit: Payer: Self-pay

## 2015-06-12 DIAGNOSIS — Z1231 Encounter for screening mammogram for malignant neoplasm of breast: Secondary | ICD-10-CM

## 2015-08-20 ENCOUNTER — Ambulatory Visit: Payer: Medicare PPO

## 2015-08-26 ENCOUNTER — Ambulatory Visit: Payer: Medicare PPO | Admitting: General Surgery

## 2015-08-27 ENCOUNTER — Ambulatory Visit
Admission: RE | Admit: 2015-08-27 | Discharge: 2015-08-27 | Disposition: A | Discharge status: Home / Self Care | Payer: Medicare Other | Source: Ambulatory Visit | Attending: General Surgery | Admitting: General Surgery

## 2015-08-27 ENCOUNTER — Other Ambulatory Visit: Payer: Self-pay | Admitting: General Surgery

## 2015-08-27 DIAGNOSIS — Z1231 Encounter for screening mammogram for malignant neoplasm of breast: Secondary | ICD-10-CM | POA: Diagnosis not present

## 2015-09-02 ENCOUNTER — Encounter: Payer: Self-pay | Admitting: *Deleted

## 2015-09-03 ENCOUNTER — Encounter: Payer: Self-pay | Admitting: General Surgery

## 2015-09-03 ENCOUNTER — Ambulatory Visit (INDEPENDENT_AMBULATORY_CARE_PROVIDER_SITE_OTHER): Payer: Medicare Other | Admitting: General Surgery

## 2015-09-03 VITALS — BP 124/68 | HR 70 | Resp 12 | Ht 66.0 in | Wt 185.0 lb

## 2015-09-03 DIAGNOSIS — Z853 Personal history of malignant neoplasm of breast: Secondary | ICD-10-CM

## 2015-09-03 NOTE — Progress Notes (Signed)
Patient ID: Olivia [name], female   DOB: 03-02-1931, 82 y.o.   MRN: [id number]  Chief Complaint  Patient presents with  . Follow-up    mammogram    HPI Olivia [name] is a 82 y.o. female who presents for a breast evaluation. The most recent mammogram was done on .08/20/15. No complaints. Patient does perform regular self breast checks and gets regular mammograms done.   I have reviewed the history of present illness with the patient.  HPI  Past Medical History:  Diagnosis Date  . Arthritis   . Breast cancer ([place]) 2001   left, invasive ductal carcinoma T1 N0 ER/PR positive  . Colitis March 2016  . Hypertension   . Personal history of malignant neoplasm of breast     Past Surgical History:  Procedure Laterality Date  . BREAST BIOPSY Left 2001   +  . BREAST BIOPSY Left 2010   stereo scar tissure 2010  . BREAST EXCISIONAL BIOPSY Left 2001   + Radation  . BREAST LUMPECTOMY Left 2001  . COLONOSCOPY  2012   Dr. Vira Agar  . EYE SURGERY  2006, 2010   cataract  . TONSILLECTOMY  as child    Family History  Problem Relation Age of Onset  . Diabetes Father   . Cancer Mother   . Breast cancer Mother 64  . Breast cancer Sister 72  . Breast cancer Paternal Aunt 24    Social History Social History  Substance Use Topics  . Smoking status: Never Smoker  . Smokeless tobacco: Never Used  . Alcohol use No    No Known Allergies  Current Outpatient Prescriptions  Medication Sig Dispense Refill  . acetaminophen (TYLENOL) 500 MG tablet Take 500 mg by mouth every 6 (six) hours as needed for pain.    Marland Kitchen atorvastatin (LIPITOR) 10 MG tablet Take 10 mg by mouth daily.    . Calcium Carbonate-Vitamin D (CALCIUM + D PO) Take 1 tablet by mouth 2 (two) times daily.    . furosemide (LASIX) 20 MG tablet Take 20 mg by mouth as needed.    . Multiple Vitamin (MULTIVITAMIN) capsule Take 1 capsule by mouth daily.    . ramipril (ALTACE) 5 MG capsule Take 5 mg by mouth daily.     No  current facility-administered medications for this visit.     Review of Systems Review of Systems  Constitutional: Negative.   Respiratory: Negative.   Cardiovascular: Negative.   Gastrointestinal: Negative.   Genitourinary: Negative.   Musculoskeletal: Positive for arthralgias.    Blood pressure 124/68, pulse 70, resp. rate 12, height 5\' 6"  (1.676 m), weight 185 lb (83.9 kg).  Physical Exam Physical Exam  Constitutional: She is oriented to person, place, and time. She appears well-developed and well-nourished.  Eyes: Conjunctivae are normal. No scleral icterus.  Neck: Neck supple.  Cardiovascular: Normal rate, regular rhythm and normal heart sounds.   Pulmonary/Chest: Effort normal and breath sounds normal. Right breast exhibits no inverted nipple, no mass, no nipple discharge, no skin change and no tenderness. Left breast exhibits no inverted nipple, no mass, no nipple discharge, no skin change and no tenderness.  Abdominal: Soft. Bowel sounds are normal. There is no tenderness.  Lymphadenopathy:    She has no cervical adenopathy.    She has no axillary adenopathy.  Neurological: She is alert and oriented to person, place, and time.  Skin: Skin is warm.    Data Reviewed Mammogram reviewed-stable   Assessment    History  of left breast cancer in 2001. Stable exam.    Plan    Patient will be asked to return to the office in one year with a bilateral screening mammogram.     This information has been scribed by Gaspar Cola CMA.   Joren Rehm G 09/03/2015, 11:37 AM

## 2015-09-03 NOTE — Patient Instructions (Signed)
Patient will be asked to return to the office in one year with a bilateral screening mammogram. 

## 2016-06-27 ENCOUNTER — Other Ambulatory Visit: Payer: Self-pay

## 2016-06-27 DIAGNOSIS — Z1231 Encounter for screening mammogram for malignant neoplasm of breast: Secondary | ICD-10-CM

## 2016-07-26 ENCOUNTER — Other Ambulatory Visit: Payer: Self-pay | Admitting: Ophthalmology

## 2016-07-26 DIAGNOSIS — G453 Amaurosis fugax: Secondary | ICD-10-CM

## 2016-07-27 ENCOUNTER — Other Ambulatory Visit
Admission: RE | Admit: 2016-07-27 | Discharge: 2016-07-27 | Disposition: A | Discharge status: Home / Self Care | Payer: Medicare Other | Source: Ambulatory Visit | Attending: Ophthalmology | Admitting: Ophthalmology

## 2016-07-27 DIAGNOSIS — G453 Amaurosis fugax: Secondary | ICD-10-CM | POA: Diagnosis present

## 2016-07-27 DIAGNOSIS — Z961 Presence of intraocular lens: Secondary | ICD-10-CM | POA: Diagnosis present

## 2016-07-27 LAB — SEDIMENTATION RATE: SED RATE: 17 mm/h (ref 0–30)

## 2016-07-27 LAB — C-REACTIVE PROTEIN: CRP: <0.8 mg/dL (ref <1.0)

## 2016-08-01 ENCOUNTER — Ambulatory Visit: Payer: Medicare Other

## 2016-08-04 ENCOUNTER — Ambulatory Visit
Admission: RE | Admit: 2016-08-04 | Discharge: 2016-08-04 | Disposition: A | Discharge status: Home / Self Care | Payer: Medicare Other | Source: Ambulatory Visit | Attending: Ophthalmology | Admitting: Ophthalmology

## 2016-08-04 DIAGNOSIS — Z961 Presence of intraocular lens: Secondary | ICD-10-CM | POA: Insufficient documentation

## 2016-08-04 DIAGNOSIS — I6523 Occlusion and stenosis of bilateral carotid arteries: Secondary | ICD-10-CM | POA: Insufficient documentation

## 2016-08-04 DIAGNOSIS — G453 Amaurosis fugax: Secondary | ICD-10-CM

## 2016-08-29 ENCOUNTER — Other Ambulatory Visit: Payer: Self-pay | Admitting: General Surgery

## 2016-08-29 ENCOUNTER — Ambulatory Visit
Admission: RE | Admit: 2016-08-29 | Discharge: 2016-08-29 | Disposition: A | Discharge status: Home / Self Care | Payer: Medicare Other | Source: Ambulatory Visit | Attending: General Surgery | Admitting: General Surgery

## 2016-08-29 DIAGNOSIS — Z1231 Encounter for screening mammogram for malignant neoplasm of breast: Secondary | ICD-10-CM

## 2016-09-06 ENCOUNTER — Encounter: Payer: Self-pay | Admitting: General Surgery

## 2016-09-06 ENCOUNTER — Ambulatory Visit (INDEPENDENT_AMBULATORY_CARE_PROVIDER_SITE_OTHER): Payer: Medicare Other | Admitting: General Surgery

## 2016-09-06 VITALS — BP 132/74 | HR 76 | Resp 14 | Ht 65.0 in | Wt 187.0 lb

## 2016-09-06 DIAGNOSIS — Z853 Personal history of malignant neoplasm of breast: Secondary | ICD-10-CM | POA: Diagnosis not present

## 2016-09-06 NOTE — Progress Notes (Signed)
Patient ID: Olivia [name], female   DOB: Jun 17, 1931, 83 y.o.   MRN: [id number]  Chief Complaint  Patient presents with  . Follow-up    mammogram    HPI Olivia [name] is a 83 y.o. female.  who presents for her follow up breast cancer and a breast evaluation. The most recent mammogram was done on 08-29-16.  Patient does perform regular self breast checks and gets regular mammograms done.  No new breast issues. She has had some light headedness on and off for a couple of months, she states Dr. Hall Busing is following. She recently had some visual problems and followed by Dr Sandra Cockayne.  HPI  Past Medical History:  Diagnosis Date  . Arthritis   . Breast cancer ([place]) 2001   left, invasive ductal carcinoma T1 N0 ER/PR positive  . Colitis March 2016  . Hypertension   . Personal history of malignant neoplasm of breast     Past Surgical History:  Procedure Laterality Date  . BREAST BIOPSY Left 2001   +  . BREAST BIOPSY Left 2010   stereo scar tissure 2010  . BREAST EXCISIONAL BIOPSY Left 2001   + Radation  . BREAST LUMPECTOMY Left 2001  . COLONOSCOPY  2012   Dr. Vira Agar  . EYE SURGERY  2006, 2010   cataract  . TONSILLECTOMY  as child    Family History  Problem Relation Age of Onset  . Diabetes Father   . Cancer Mother   . Breast cancer Mother 35  . Breast cancer Sister 37  . Breast cancer Paternal Aunt 4    Social History Social History  Substance Use Topics  . Smoking status: Never Smoker  . Smokeless tobacco: Never Used  . Alcohol use No    No Known Allergies  Current Outpatient Prescriptions  Medication Sig Dispense Refill  . acetaminophen (TYLENOL) 500 MG tablet Take 500 mg by mouth every 6 (six) hours as needed for pain.    Marland Kitchen atorvastatin (LIPITOR) 10 MG tablet Take 10 mg by mouth daily.    . Calcium Carbonate-Vitamin D (CALCIUM + D PO) Take 1 tablet by mouth 2 (two) times daily.    . furosemide (LASIX) 20 MG tablet Take 20 mg by mouth as needed.    .  Multiple Vitamin (MULTIVITAMIN) capsule Take 1 capsule by mouth daily.    . ramipril (ALTACE) 5 MG capsule Take 5 mg by mouth daily.     No current facility-administered medications for this visit.     Review of Systems Review of Systems  Constitutional: Negative.   Respiratory: Negative.   Cardiovascular: Negative.   Neurological: Positive for light-headedness.    Blood pressure 132/74, pulse 76, resp. rate 14, height 5\' 5"  (1.651 m), weight 187 lb (84.8 kg).  Physical Exam Physical Exam  Constitutional: She is oriented to person, place, and time. She appears well-developed and well-nourished.  HENT:  Mouth/Throat: Oropharynx is clear and moist.  Eyes: Conjunctivae are normal. No scleral icterus.  Neck: Neck supple.  Cardiovascular: Normal rate, regular rhythm and normal heart sounds.   Pulmonary/Chest: Effort normal and breath sounds normal. Right breast exhibits no inverted nipple, no mass, no nipple discharge, no skin change and no tenderness. Left breast exhibits no inverted nipple, no mass, no nipple discharge, no skin change and no tenderness.    Abdominal: Soft. Bowel sounds are normal. There is no tenderness.  Lymphadenopathy:    She has no cervical adenopathy.    She has no axillary  adenopathy.  Neurological: She is alert and oriented to person, place, and time.  Skin: Skin is warm and dry.  Psychiatric: Her behavior is normal.    Data Reviewed  Mammogram reviewed-stable  Assessment    History of left breast cancer in 2001. Stable exam    Plan   Patient will be asked to continue annual bilateral screening mammograms with her PCP-Dr Hall Busing. Follow up here as needed.     HPI, Physical Exam, Assessment and Plan have been scribed under the direction and in the presence of Mckinley Jewel, MD Karie Fetch, RN  I have completed the exam and reviewed the above documentation for accuracy and completeness.  I agree with the above.  Haematologist has been used and  any errors in dictation or transcription are unintentional.  Berenis Corter G. Jamal Collin, M.D., F.A.C.S.  Junie Panning G 09/06/2016, 1:52 PM

## 2016-09-06 NOTE — Patient Instructions (Signed)
The patient is aware to call back for any questions or concerns.  

## 2017-09-06 ENCOUNTER — Other Ambulatory Visit: Payer: Self-pay | Admitting: Internal Medicine

## 2017-09-06 DIAGNOSIS — Z1231 Encounter for screening mammogram for malignant neoplasm of breast: Secondary | ICD-10-CM

## 2017-09-26 ENCOUNTER — Ambulatory Visit
Admission: RE | Admit: 2017-09-26 | Discharge: 2017-09-26 | Disposition: A | Discharge status: Home / Self Care | Payer: Medicare Other | Source: Ambulatory Visit | Attending: Internal Medicine | Admitting: Internal Medicine

## 2017-09-26 DIAGNOSIS — Z1231 Encounter for screening mammogram for malignant neoplasm of breast: Secondary | ICD-10-CM | POA: Diagnosis present

## 2017-09-26 HISTORY — DX: Personal history of irradiation: Z92.3

## 2017-12-20 ENCOUNTER — Ambulatory Visit
Admission: RE | Admit: 2017-12-20 | Discharge: 2017-12-20 | Disposition: A | Discharge status: Home / Self Care | Payer: Medicare Other | Source: Ambulatory Visit | Attending: Internal Medicine | Admitting: Internal Medicine

## 2017-12-20 ENCOUNTER — Other Ambulatory Visit: Payer: Self-pay | Admitting: Internal Medicine

## 2017-12-20 DIAGNOSIS — M7989 Other specified soft tissue disorders: Secondary | ICD-10-CM

## 2017-12-21 ENCOUNTER — Ambulatory Visit
Admission: RE | Admit: 2017-12-21 | Discharge: 2017-12-21 | Disposition: A | Discharge status: Home / Self Care | Payer: Medicare Other | Source: Ambulatory Visit | Attending: Internal Medicine | Admitting: Internal Medicine

## 2017-12-21 DIAGNOSIS — M7989 Other specified soft tissue disorders: Secondary | ICD-10-CM

## 2017-12-26 ENCOUNTER — Encounter: Payer: Self-pay | Admitting: General Surgery

## 2017-12-26 ENCOUNTER — Other Ambulatory Visit: Payer: Self-pay

## 2017-12-26 ENCOUNTER — Ambulatory Visit (INDEPENDENT_AMBULATORY_CARE_PROVIDER_SITE_OTHER): Payer: Medicare Other | Admitting: General Surgery

## 2017-12-26 DIAGNOSIS — Z853 Personal history of malignant neoplasm of breast: Secondary | ICD-10-CM | POA: Diagnosis not present

## 2017-12-26 DIAGNOSIS — M7989 Other specified soft tissue disorders: Secondary | ICD-10-CM

## 2017-12-26 NOTE — Patient Instructions (Addendum)
Patient to have a ct scan done . The patient is aware to call back for any questions or concerns.   The patient is scheduled for a CT Chest with contrast at [facility] on 12/29/17 at 11:30 am. She will arrive by 11:15 am and have liquids only for 4 hours prior. The patient is aware of date, time, and instructions.

## 2017-12-26 NOTE — Progress Notes (Signed)
Patient ID: Olivia [name], female   DOB: Mar 22, 1931, 84 y.o.   MRN: [id number]  Chief Complaint  Patient presents with  . Other    HPI Olivia [name] is a 84 y.o. female here today for a evaluation of left forearm swelling.Patient states her left hand felt really tight.  Patient states this has been going on about a week now. She states she got a cut on her arm and she thinks that is why she was swelling and water was coming from the cut. Admits to left axilla itching.  HPI  Past Medical History:  Diagnosis Date  . Arthritis   . Breast cancer ([place]) 2001   left, invasive ductal carcinoma T1 N0 ER/PR positive  . Colitis March 2016  . Hypertension   . Personal history of malignant neoplasm of breast   . Personal history of radiation therapy     Past Surgical History:  Procedure Laterality Date  . BREAST BIOPSY Left 2001   +  . BREAST BIOPSY Left 2010   stereo scar tissure 2010  . BREAST EXCISIONAL BIOPSY Left 2001   + Radation  . BREAST LUMPECTOMY Left 2001  . COLONOSCOPY  2012   Dr. Vira Agar  . EYE SURGERY  2006, 2010   cataract  . TONSILLECTOMY  as child    Family History  Problem Relation Age of Onset  . Diabetes Father   . Cancer Mother   . Breast cancer Mother 76  . Breast cancer Sister 27  . Breast cancer Paternal Aunt 84    Social History Social History   Tobacco Use  . Smoking status: Never Smoker  . Smokeless tobacco: Never Used  Substance Use Topics  . Alcohol use: No  . Drug use: No    No Known Allergies  Current Outpatient Medications  Medication Sig Dispense Refill  . acetaminophen (TYLENOL) 500 MG tablet Take 500 mg by mouth every 6 (six) hours as needed for pain.    Marland Kitchen atorvastatin (LIPITOR) 10 MG tablet Take 10 mg by mouth daily.    . Calcium Carbonate-Vitamin D (CALCIUM + D PO) Take 1 tablet by mouth 2 (two) times daily.    . furosemide (LASIX) 20 MG tablet Take 20 mg by mouth as needed.    . Multiple Vitamin (MULTIVITAMIN) capsule  Take 1 capsule by mouth daily.    . ramipril (ALTACE) 5 MG capsule Take 5 mg by mouth daily.     No current facility-administered medications for this visit.     Review of Systems Review of Systems  Constitutional: Negative.   Respiratory: Negative.   Cardiovascular: Negative.     Blood pressure (!) 152/87, pulse 88, temperature 97.9 F (36.6 C), temperature source Skin, resp. rate 16, height 5\' 4"  (1.626 m), weight 178 lb (80.7 kg), SpO2 98 %.  Physical Exam Physical Exam Exam conducted with a chaperone present.  HENT:     Mouth/Throat:     Pharynx: No oropharyngeal exudate.  Eyes:     General: No scleral icterus. Neck:     Musculoskeletal: Neck supple.  Cardiovascular:     Rate and Rhythm: Normal rate and regular rhythm.  Pulmonary:     Effort: Pulmonary effort is normal.     Breath sounds: Normal breath sounds.  Chest:     Breasts:        Right: No inverted nipple, mass, nipple discharge, skin change or tenderness.        Left: No inverted nipple, mass, nipple  discharge, skin change or tenderness.  Lymphadenopathy:     Upper Body:     Right upper body: No axillary adenopathy.     Left upper body: No supraclavicular or axillary (Mild fullness without clearly palpable adenopathy. ) adenopathy.  Skin:    General: Skin is warm and dry.  Neurological:     Mental Status: She is alert and oriented to person, place, and time.  Psychiatric:        Mood and Affect: Mood normal.     Data Reviewed Office notes of September 06, 2016 from Dr. Jamal Collin did not describe swelling of the arm or soft axillary fullness. September 26, 2017 bilateral screening mammograms reviewed.  BI-RADS-1.  Venous ultrasound of December 21, 2017 reviewed: No evidence of left upper extremity DVT.  Chest x-ray of December 20, 2017 suggested a possible nodular density versus confluence of shadows at the right lung apex.  CT with contrast recommended.  Measurement of the upper extremities were  completed at a location 15 cm above as well as 10 and 20 cm below the olecranon process.  Right: 29, 23, 17 cm.  Left: 32, 27, 19 cm.  Laboratory studies dated November 02, 2017 were reviewed.  CBC showed hemoglobin 12.4 with an MCV of 88, white blood cell count of 6900 with normal differential.  Platelet count of 273,000.    Comprehensive metabolic panel showed normal creatinine of 0.69 with an estimated GFR of 80.  Normal electrolytes with the exception of minimal elevation of the serum chloride at 107.  Normal liver function studies.    Normal TSH.  Assessment    Uniform swelling of the left upper extremity consistent with lymphedema.  Abnormal chest x-ray involving the right hemithorax.    Plan  The patient reports that she had not had swelling before, and none is noted on previous surgical follow-up notes.  It began strangely unlikely to develop lymphedema without a preceding infection at this late date after her breast cancer management in the early years of this [place].  In light of the mild fullness in the axilla and the unexplained upper extremity swelling (negative ultrasound) we will arrange for a CT of the chest with contrast to assess for secondary causes of late upper extremity swelling.   The patient is aware to call back for any questions or concerns.   HPI, Physical Exam, Assessment and Plan have been scribed under the direction and in the presence of Hervey Ard, MD.  Gaspar Cola, CMA  HPI, Physical Exam, Assessment and Plan have been scribed under the direction and in the presence of Robert Bellow, MD. Karie Fetch, RN  The patient is scheduled for a CT Chest with contrast at [facility] on 12/29/17 at 11:30 am. She will arrive by 11:15 am and have liquids only for 4 hours prior. The patient is aware of date, time, and instructions.  Documented by Caryl-Lyn Otis Brace LPN  Forest Gleason Byrnett 12/27/2017, 6:10 AM

## 2017-12-27 DIAGNOSIS — M7989 Other specified soft tissue disorders: Secondary | ICD-10-CM | POA: Insufficient documentation

## 2017-12-29 ENCOUNTER — Ambulatory Visit
Admission: RE | Admit: 2017-12-29 | Discharge: 2017-12-29 | Disposition: A | Discharge status: Home / Self Care | Payer: Medicare Other | Source: Ambulatory Visit | Attending: General Surgery | Admitting: General Surgery

## 2017-12-29 DIAGNOSIS — Z853 Personal history of malignant neoplasm of breast: Secondary | ICD-10-CM | POA: Diagnosis present

## 2017-12-29 DIAGNOSIS — M7989 Other specified soft tissue disorders: Secondary | ICD-10-CM | POA: Diagnosis not present

## 2017-12-29 LAB — POCT I-STAT CREATININE: Creatinine, Ser: 0.8 mg/dL (ref 0.44–1.00)

## 2017-12-29 MED ORDER — IOHEXOL 300 MG/ML  SOLN
75.0000 mL | Freq: Once | INTRAMUSCULAR | Status: AC | PRN
  Administered 2017-12-29: 75 mL via INTRAVENOUS

## 2017-12-30 ENCOUNTER — Telehealth: Payer: Self-pay | Admitting: General Surgery

## 2017-12-30 NOTE — Telephone Encounter (Signed)
Reviewed yesterday's CT results.  No clear reason for LUE swelling at this late date.   With arms elevated over the head for the scan, one image suggested possible impingement of the axillary vein, but U/S showed normal flow patterns.  December 21, 2017 ultrasound:  Subclavian Vein: No evidence of thrombus. Normal compressibility, respiratory phasicity and response to augmentation.  Axillary Vein: No evidence of thrombus. Normal compressibility, respiratory phasicity and response to augmentation.  Certainly, no lymphadenopathy. Patient reports it waxes and wanes in severity, still without any pain.   Regarding pulmonary findings, will forward to Dr. Hall Busing for his decision on repeat imaging in six months as recommended in the study.    1. Parenchymal changes throughout the lungs bilaterally. In the right lung apex, some of the parenchymal changes appear minimally nodular although more suspicious for scarring than tumor. In the left upper lobe, parenchymal changes suggestive of response to radiation therapy. Given patient's history of malignancy and lack of prior chest CT, follow-up chest CT in 6 months may be considered to confirm stability of these findings.  Will arrange a follow up visit the end of January for reassessment and to determine if physical therapy required.

## 2018-02-01 ENCOUNTER — Encounter (INDEPENDENT_AMBULATORY_CARE_PROVIDER_SITE_OTHER): Payer: Self-pay | Admitting: Vascular Surgery

## 2018-02-01 ENCOUNTER — Ambulatory Visit (INDEPENDENT_AMBULATORY_CARE_PROVIDER_SITE_OTHER): Payer: Medicare Other | Admitting: Vascular Surgery

## 2018-02-01 VITALS — BP 112/70 | HR 101 | Resp 16 | Ht 65.0 in | Wt 183.2 lb

## 2018-02-01 DIAGNOSIS — E782 Mixed hyperlipidemia: Secondary | ICD-10-CM | POA: Diagnosis not present

## 2018-02-01 DIAGNOSIS — Z853 Personal history of malignant neoplasm of breast: Secondary | ICD-10-CM | POA: Diagnosis not present

## 2018-02-01 DIAGNOSIS — I890 Lymphedema, not elsewhere classified: Secondary | ICD-10-CM | POA: Diagnosis not present

## 2018-02-01 DIAGNOSIS — I10 Essential (primary) hypertension: Secondary | ICD-10-CM

## 2018-02-01 NOTE — Addendum Note (Signed)
Encounter addended by: Helene Kelp, RT on: 02/01/2018 11:54 AM  Actions taken: Imaging Exam begun, Image imported

## 2018-02-03 ENCOUNTER — Encounter (INDEPENDENT_AMBULATORY_CARE_PROVIDER_SITE_OTHER): Payer: Self-pay | Admitting: Vascular Surgery

## 2018-02-03 DIAGNOSIS — I10 Essential (primary) hypertension: Secondary | ICD-10-CM | POA: Insufficient documentation

## 2018-02-03 DIAGNOSIS — E785 Hyperlipidemia, unspecified: Secondary | ICD-10-CM | POA: Insufficient documentation

## 2018-02-03 NOTE — Progress Notes (Signed)
MRN : [id number]  Olivia [name] is a 85 y.o. ([birth year]) female who presents with chief complaint of  Chief Complaint  Patient presents with  . New Patient (Initial Visit)  .  History of Present Illness:   Patient is seen for evaluation of left arm swelling. The patient first noticed the swelling a few years ago but is now concerned because of an increase in the overall edema. The swelling is not associated with pain but she does note some discoloration of her hand and fingers. The patient notes that in the morning the left arm is significantly improved but the swelling steadily worsens throughout the course of the day. Elevation makes the arm better, dependency makes it much worse.   Of importance she is s/p left breast lumpectomy with an axillary dissection in 2001.  Initially post op and for the first few years there did not seem to be much swelling.   There is no history of ulcerations associated with the swelling.   The patient denies any recent changes in their medications.  The patient has not been wearing graduated compression.  The patient has no had any past angiography, interventions or vascular surgery.  The patient denies a history of DVT or PE. There is no prior history of phlebitis. There is no history of primary lymphedema.  There is no history of radiation treatment to the groin or pelvis No history of malignancies. No history of trauma or groin or pelvic surgery. No history of foreign travel or parasitic infections area    Current Meds  Medication Sig  . acetaminophen (TYLENOL) 500 MG tablet Take 500 mg by mouth every 6 (six) hours as needed for pain.  Marland Kitchen atorvastatin (LIPITOR) 10 MG tablet Take 10 mg by mouth daily.  . calcium carbonate (OS-CAL - DOSED IN MG OF ELEMENTAL CALCIUM) 1250 (500 Ca) MG tablet Take 1 tablet by mouth.  . Calcium Carbonate-Vitamin D (CALCIUM + D PO) Take 1 tablet by mouth 2 (two) times daily.  . Cranberry 400 MG CAPS Take by  mouth.  . diclofenac sodium (VOLTAREN) 1 % GEL Apply 2 g topically 4 (four) times daily.  . furosemide (LASIX) 20 MG tablet Take 20 mg by mouth as needed.  . mometasone (NASONEX) 50 MCG/ACT nasal spray Place 2 sprays into the nose daily.  . Multiple Vitamin (MULTIVITAMIN) capsule Take 1 capsule by mouth daily.  . ramipril (ALTACE) 5 MG capsule Take 5 mg by mouth daily.    Past Medical History:  Diagnosis Date  . Arthritis   . Breast cancer ([place]) 2001   left, invasive ductal carcinoma T1 N0 ER/PR positive  . Colitis March 2016  . Hypertension   . Personal history of malignant neoplasm of breast   . Personal history of radiation therapy     Past Surgical History:  Procedure Laterality Date  . BREAST BIOPSY Left 2001   +  . BREAST BIOPSY Left 2010   stereo scar tissure 2010  . BREAST EXCISIONAL BIOPSY Left 2001   + Radation  . BREAST LUMPECTOMY Left 2001  . COLONOSCOPY  2012   Dr. Vira Agar  . EYE SURGERY  2006, 2010   cataract  . TONSILLECTOMY  as child    Social History Social History   Tobacco Use  . Smoking status: Never Smoker  . Smokeless tobacco: Never Used  Substance Use Topics  . Alcohol use: No  . Drug use: No    Family History Family History  Problem  Relation Age of Onset  . Diabetes Father   . Cancer Mother   . Breast cancer Mother 48  . Breast cancer Sister 72  . Breast cancer Paternal Aunt 83  No family history of bleeding/clotting disorders, porphyria or autoimmune disease   No Known Allergies   REVIEW OF SYSTEMS (Negative unless checked)  Constitutional: [] Weight loss  [] Fever  [] Chills Cardiac: [] Chest pain   [] Chest pressure   [] Palpitations   [] Shortness of breath when laying flat   [] Shortness of breath with exertion. Vascular:  [] Pain in legs with walking   [] Pain in legs at rest  [] History of DVT   [] Phlebitis   [] Swelling in legs   [] Varicose veins   [] Non-healing ulcers Pulmonary:   [] Uses home oxygen   [] Productive cough    [] Hemoptysis   [] Wheeze  [] COPD   [] Asthma Neurologic:  [] Dizziness   [] Seizures   [] History of stroke   [] History of TIA  [] Aphasia   [] Vissual changes   [] Weakness or numbness in arm   [] Weakness or numbness in leg Musculoskeletal:   [] Joint swelling   [] Joint pain   [] Low back pain Hematologic:  [] Easy bruising  [] Easy bleeding   [] Hypercoagulable state   [] Anemic Gastrointestinal:  [] Diarrhea   [] Vomiting  [] Gastroesophageal reflux/heartburn   [] Difficulty swallowing. Genitourinary:  [] Chronic kidney disease   [] Difficult urination  [] Frequent urination   [] Blood in urine Skin:  [] Rashes   [] Ulcers  Psychological:  [] History of anxiety   []  History of major depression.  Physical Examination  Vitals:   02/01/18 1340  BP: 112/70  Pulse: (!) 101  Resp: 16  Weight: 183 lb 3.2 oz (83.1 kg)  Height: 5\' 5"  (1.651 m)   Body mass index is 30.49 kg/m. Gen: WD/WN, NAD Head: [place]/AT, No temporalis wasting.  Ear/Nose/Throat: Hearing grossly intact, nares w/o erythema or drainage, poor dentition Eyes: PER, EOMI, sclera nonicteric.  Neck: Supple, no masses.  No bruit or JVD.  Pulmonary:  Good air movement, clear to auscultation bilaterally, no use of accessory muscles.  Cardiac: RRR, normal S1, S2, no Murmurs. Vascular:  2-3+ soft pitting edema left arm. Vessel Right Left  Radial Palpable Palpable  Ulnar Palpable Palpable  Brachial Palpable Palpable  Carotid Palpable Palpable  Gastrointestinal: soft, non-distended. No guarding/no peritoneal signs.  Musculoskeletal: M/S 5/5 throughout.  No deformity or atrophy.  Neurologic: CN 2-12 intact. Pain and light touch intact in extremities.  Symmetrical.  Speech is fluent. Motor exam as listed above. Psychiatric: Judgment intact, Mood & affect appropriate for pt's clinical situation. Dermatologic: No rashes or ulcers noted.  No changes consistent with cellulitis. Lymph : No Cervical lymphadenopathy, no lichenification or skin changes of chronic  lymphedema.  CBC Lab Results  Component Value Date   WBC 4.8 09/04/2011   HGB 9.6 (L) 09/04/2011   HCT 27.7 (L) 09/04/2011   MCV 87 09/04/2011   PLT 152 09/04/2011    BMET    Component Value Date/Time   NA 143 09/05/2011 0701   K 4.6 09/05/2011 0701   CL 110 (H) 09/05/2011 0701   CO2 23 09/05/2011 0701   GLUCOSE 105 (H) 09/05/2011 0701   BUN 19 (H) 09/05/2011 0701   CREATININE 0.80 12/29/2017 1120   CREATININE 0.81 09/05/2011 0701   CALCIUM 9.4 09/05/2011 0701   GFRNONAA >60 09/05/2011 0701   GFRAA >60 09/05/2011 0701   CrCl cannot be calculated (Patient's most recent lab result is older than the maximum 21 days allowed.).  COAG No results  found for: INR, PROTIME  Radiology No results found.   Assessment/Plan 1. Lymphedema of left arm Recommend:  No surgery or intervention at this point in time.    I have reviewed with the patient her arm swelling and why it causes symptoms.  Patient will begin wearing graduated a compression sleeve on her left arm on a daily basis. The patient will  beginning wearing the compression first thing in the morning and removing it in the evening. The patient is instructed specifically not to sleep in the sleeve.    In addition, behavioral modification including several periods of elevation of the left upper extremity during the day will be continued.  This was reviewed with the patient during the initial visit.  The patient will also continue routine exercise, especially walking on a daily basis as was discussed during the initial visit.    Despite conservative treatments including graduated compression therapy class 1 and behavioral modification including exercise and elevation the patient  has not obtained adequate control of the left arm lymphedema.  In addition this edema is a result of breast cancer surgery  The patient still has stage 3 lymphedema and therefore, I believe that a lymph pump should be added to improve the control of the  patient's lymphedema.  Additionally, a lymph pump is warranted because it will reduce the risk of cellulitis and ulceration in the future as well as eliminate the possibility of developing a sarcoma.  Patient should follow-up in six months    2. Personal history of breast cancer S/p successful treatment continue to follow with Dr Bary Castilla  3. Essential hypertension Continue antihypertensive medications as already ordered, these medications have been reviewed and there are no changes at this time.   4. Mixed hyperlipidemia Continue statin as ordered and reviewed, no changes at this time     Hortencia Pilar, MD  02/03/2018 1:57 PM

## 2018-02-12 ENCOUNTER — Telehealth (INDEPENDENT_AMBULATORY_CARE_PROVIDER_SITE_OTHER): Payer: Self-pay

## 2018-02-12 NOTE — Telephone Encounter (Signed)
Olivia [name] from [facility] called and left a message on the triage line and stated that in January a medical neccesity request was faxed to the office. She says that they have received no communication back. She would like to know if anyone received it or is working on it at this time. Fax number is [phone] If the form needs to be refaxed call [phone].

## 2018-02-13 NOTE — Telephone Encounter (Signed)
This information was placed on Dr. Nino Parsley desk at that time it was faxed over, it has not been given to me to fax back. As of this morning another fax regarding this patient was placed on his desk to be signed and faxed back to [place].

## 2018-08-06 ENCOUNTER — Ambulatory Visit (INDEPENDENT_AMBULATORY_CARE_PROVIDER_SITE_OTHER): Payer: Medicare Other | Admitting: Vascular Surgery

## 2018-08-06 ENCOUNTER — Other Ambulatory Visit: Payer: Self-pay

## 2018-08-06 ENCOUNTER — Encounter (INDEPENDENT_AMBULATORY_CARE_PROVIDER_SITE_OTHER): Payer: Self-pay | Admitting: Vascular Surgery

## 2018-08-06 ENCOUNTER — Encounter (INDEPENDENT_AMBULATORY_CARE_PROVIDER_SITE_OTHER): Payer: Self-pay

## 2018-08-06 VITALS — BP 136/78 | HR 83 | Resp 16 | Ht 65.0 in | Wt 177.0 lb

## 2018-08-06 DIAGNOSIS — I10 Essential (primary) hypertension: Secondary | ICD-10-CM | POA: Diagnosis not present

## 2018-08-06 DIAGNOSIS — Z79899 Other long term (current) drug therapy: Secondary | ICD-10-CM | POA: Diagnosis not present

## 2018-08-06 DIAGNOSIS — I890 Lymphedema, not elsewhere classified: Secondary | ICD-10-CM

## 2018-08-06 DIAGNOSIS — E782 Mixed hyperlipidemia: Secondary | ICD-10-CM | POA: Diagnosis not present

## 2018-08-06 NOTE — Progress Notes (Signed)
MRN : [id number]  Olivia [name] is a 85 y.o. ([birth year]) female who presents with chief complaint of  Chief Complaint  Patient presents with  . Follow-up    6 mo f/u  .  History of Present Illness:   The patient returns to the office for followup evaluation regarding left arm swelling.  The swelling has improved quite a bit and the pain associated with swelling has decreased substantially, in fact she notes it is essentially resolved. There have not been any interval development of a ulcerations or wounds.  Since the previous visit the patient has been wearing a graduated compression sleeve and has noted significant improvement in the lymphedema. The patient has been using compression routinely.  The patient also states elevation during the day and exercise is being done too.  The patient denies problems with the pump, noting it is working well and the leggings are in good condition.  Since the previous visit the patient has been using the lymph pump on a routine basis and  has noted significant improvement in the lymphedema.   Patient stated the lymph pump has been a very positive factor in her care.      Current Meds  Medication Sig  . acetaminophen (TYLENOL) 500 MG tablet Take 500 mg by mouth every 6 (six) hours as needed for pain.  Marland Kitchen atorvastatin (LIPITOR) 10 MG tablet Take 10 mg by mouth daily.  . calcium carbonate (OS-CAL - DOSED IN MG OF ELEMENTAL CALCIUM) 1250 (500 Ca) MG tablet Take 1 tablet by mouth.  . Calcium Carbonate-Vitamin D (CALCIUM + D PO) Take 1 tablet by mouth 2 (two) times daily.  . Cranberry 400 MG CAPS Take by mouth.  . diclofenac sodium (VOLTAREN) 1 % GEL Apply 2 g topically 4 (four) times daily.  . furosemide (LASIX) 20 MG tablet Take 20 mg by mouth as needed.  . mometasone (NASONEX) 50 MCG/ACT nasal spray Place 2 sprays into the nose daily.  . Multiple Vitamin (MULTIVITAMIN) capsule Take 1 capsule by mouth daily.  . ramipril (ALTACE) 5 MG  capsule Take 5 mg by mouth daily.  . Travoprost, BAK Free, (TRAVATAN) 0.004 % SOLN ophthalmic solution 1 drop at bedtime.    Past Medical History:  Diagnosis Date  . Arthritis   . Breast cancer ([place]) 2001   left, invasive ductal carcinoma T1 N0 ER/PR positive  . Colitis March 2016  . Hypertension   . Personal history of malignant neoplasm of breast   . Personal history of radiation therapy     Past Surgical History:  Procedure Laterality Date  . BREAST BIOPSY Left 2001   +  . BREAST BIOPSY Left 2010   stereo scar tissure 2010  . BREAST EXCISIONAL BIOPSY Left 2001   + Radation  . BREAST LUMPECTOMY Left 2001  . COLONOSCOPY  2012   Dr. Vira Agar  . EYE SURGERY  2006, 2010   cataract  . TONSILLECTOMY  as child    Social History Social History   Tobacco Use  . Smoking status: Never Smoker  . Smokeless tobacco: Never Used  Substance Use Topics  . Alcohol use: No  . Drug use: No    Family History Family History  Problem Relation Age of Onset  . Diabetes Father   . Cancer Mother   . Breast cancer Mother 21  . Breast cancer Sister 48  . Breast cancer Paternal Aunt 42    No Known Allergies   REVIEW OF SYSTEMS (Negative  unless checked)  Constitutional: [] Weight loss  [] Fever  [] Chills Cardiac: [] Chest pain   [] Chest pressure   [] Palpitations   [] Shortness of breath when laying flat   [] Shortness of breath with exertion. Vascular:  [] Pain in legs with walking   [] Pain in legs at rest  [] History of DVT   [] Phlebitis   [] Swelling in legs   [] Varicose veins   [] Non-healing ulcers Pulmonary:   [] Uses home oxygen   [] Productive cough   [] Hemoptysis   [] Wheeze  [] COPD   [] Asthma Neurologic:  [] Dizziness   [] Seizures   [] History of stroke   [] History of TIA  [] Aphasia   [] Vissual changes   [] Weakness or numbness in arm   [] Weakness or numbness in leg Musculoskeletal:   [] Joint swelling   [x] Joint pain   [] Low back pain Hematologic:  [] Easy bruising  [] Easy bleeding    [] Hypercoagulable state   [] Anemic Gastrointestinal:  [] Diarrhea   [] Vomiting  [] Gastroesophageal reflux/heartburn   [] Difficulty swallowing. Genitourinary:  [] Chronic kidney disease   [] Difficult urination  [] Frequent urination   [] Blood in urine Skin:  [] Rashes   [] Ulcers  Psychological:  [] History of anxiety   []  History of major depression.  Physical Examination  Vitals:   08/06/18 1001  BP: 136/78  Pulse: 83  Resp: 16  Weight: 177 lb (80.3 kg)  Height: 5\' 5"  (1.651 m)   Body mass index is 29.45 kg/m. Gen: WD/WN, NAD Head: [place]/AT, No temporalis wasting.  Ear/Nose/Throat: Hearing grossly intact, nares w/o erythema or drainage Eyes: PER, EOMI, sclera nonicteric.  Neck: Supple, no large masses.   Pulmonary:  Good air movement, no audible wheezing bilaterally, no use of accessory muscles.  Cardiac: RRR, no JVD Vascular:  Trace edema of the left arm  Skin is supple and free of ulcers or lesion Vessel Right Left  Radial Palpable Palpable  Gastrointestinal: Non-distended. No guarding/no peritoneal signs.  Musculoskeletal: M/S 5/5 throughout.  No deformity or atrophy.  Neurologic: CN 2-12 intact. Symmetrical.  Speech is fluent. Motor exam as listed above. Psychiatric: Judgment intact, Mood & affect appropriate for pt's clinical situation. Dermatologic: No rashes or ulcers noted.  No changes consistent with cellulitis. Lymph : No lichenification or skin changes of chronic lymphedema.  CBC Lab Results  Component Value Date   WBC 4.8 09/04/2011   HGB 9.6 (L) 09/04/2011   HCT 27.7 (L) 09/04/2011   MCV 87 09/04/2011   PLT 152 09/04/2011    BMET    Component Value Date/Time   NA 143 09/05/2011 0701   K 4.6 09/05/2011 0701   CL 110 (H) 09/05/2011 0701   CO2 23 09/05/2011 0701   GLUCOSE 105 (H) 09/05/2011 0701   BUN 19 (H) 09/05/2011 0701   CREATININE 0.80 12/29/2017 1120   CREATININE 0.81 09/05/2011 0701   CALCIUM 9.4 09/05/2011 0701   GFRNONAA >60 09/05/2011 0701    GFRAA >60 09/05/2011 0701   CrCl cannot be calculated (Patient's most recent lab result is older than the maximum 21 days allowed.).  COAG No results found for: INR, PROTIME  Radiology No results found.   Assessment/Plan 1. Lymphedema  No surgery or intervention at this point in time.    I have reviewed my discussion with the patient regarding lymphedema and why it  causes symptoms.  Patient will continue wearing graduated compression sleeve on a daily basis. The patient is reminded to put the sleeve on first thing in the morning and removing them in the evening. The patient is instructed specifically  not to sleep in the compression.   In addition, behavioral modification throughout the day will be continued.  This will include frequent elevation (such as elevation on 2-3 pillows), use of over the counter pain medications as needed and exercise such as walking.  The patient will continue aggressive use of the  lymph pump.  This will continue to improve the edema control and prevent sequela such as ulcers and infections and sarcoma.   The patient will follow-up with me on an annual basis.    2. Essential hypertension Continue antihypertensive medications as already ordered, these medications have been reviewed and there are no changes at this time.   3. Mixed hyperlipidemia Continue statin as ordered and reviewed, no changes at this time   Hortencia Pilar, MD  08/06/2018 10:09 AM

## 2018-10-08 ENCOUNTER — Other Ambulatory Visit: Payer: Self-pay | Admitting: Internal Medicine

## 2018-10-08 DIAGNOSIS — Z1231 Encounter for screening mammogram for malignant neoplasm of breast: Secondary | ICD-10-CM

## 2019-06-14 DIAGNOSIS — H268 Other specified cataract: Secondary | ICD-10-CM | POA: Insufficient documentation

## 2019-08-08 ENCOUNTER — Other Ambulatory Visit: Payer: Self-pay

## 2019-08-08 ENCOUNTER — Ambulatory Visit (INDEPENDENT_AMBULATORY_CARE_PROVIDER_SITE_OTHER): Payer: Medicare PPO | Admitting: Vascular Surgery

## 2019-08-08 ENCOUNTER — Encounter (INDEPENDENT_AMBULATORY_CARE_PROVIDER_SITE_OTHER): Payer: Self-pay | Admitting: Vascular Surgery

## 2019-08-08 VITALS — BP 183/103 | HR 80 | Resp 16 | Ht 64.0 in | Wt 176.0 lb

## 2019-08-08 DIAGNOSIS — I10 Essential (primary) hypertension: Secondary | ICD-10-CM | POA: Diagnosis not present

## 2019-08-08 DIAGNOSIS — R350 Frequency of micturition: Secondary | ICD-10-CM | POA: Insufficient documentation

## 2019-08-08 DIAGNOSIS — I890 Lymphedema, not elsewhere classified: Secondary | ICD-10-CM

## 2019-08-08 DIAGNOSIS — E782 Mixed hyperlipidemia: Secondary | ICD-10-CM

## 2019-08-08 DIAGNOSIS — N3941 Urge incontinence: Secondary | ICD-10-CM | POA: Insufficient documentation

## 2019-08-08 NOTE — Progress Notes (Signed)
MRN : [id number]  Olivia [name] is a 86 y.o. (November 20, 1931) female who presents with chief complaint of  Chief Complaint  Patient presents with  . Follow-up    1 yr, no studies  .  History of Present Illness:   The patient returns to the office for followup evaluation regarding left arm swelling.  The swelling has improved quite a bit and the pain associated with swelling has decreased substantially, in fact she notes it is essentially resolved. There have not been any interval development of a ulcerations or wounds.  Since the previous visit the patient has been wearing a graduated compression sleeve and has noted significant improvement in the lymphedema. The patient has been using compression routinely.  The patient also states elevation during the day and exercise is being done too.  The patient denies problems with the pump, noting it is working well and the leggings are in good condition.  Since the previous visit the patient has been using the lymph pump on a routine basis and  has noted significant improvement in the lymphedema.   Patient stated the lymph pump has been a very positive factor in her care.   Current Meds  Medication Sig  . acetaminophen (TYLENOL) 500 MG tablet Take 500 mg by mouth every 6 (six) hours as needed for pain.  Marland Kitchen atorvastatin (LIPITOR) 10 MG tablet Take 10 mg by mouth daily.  . calcium carbonate (OS-CAL - DOSED IN MG OF ELEMENTAL CALCIUM) 1250 (500 Ca) MG tablet Take 1 tablet by mouth.  . Calcium Carbonate-Vitamin D (CALCIUM + D PO) Take 1 tablet by mouth 2 (two) times daily.  . COMBIGAN 0.2-0.5 % ophthalmic solution Apply 1 drop to eye 2 (two) times daily.  . Cranberry 400 MG CAPS Take by mouth.  . diclofenac sodium (VOLTAREN) 1 % GEL Apply 2 g topically 4 (four) times daily.  . furosemide (LASIX) 20 MG tablet Take 20 mg by mouth as needed.  Marland Kitchen ketorolac (ACULAR) 0.5 % ophthalmic solution Place 1 drop into the right eye 4 (four) times  daily.  Marland Kitchen latanoprost (XALATAN) 0.005 % ophthalmic solution   . mometasone (NASONEX) 50 MCG/ACT nasal spray Place 2 sprays into the nose daily.  . Multiple Vitamin (MULTIVITAMIN) capsule Take 1 capsule by mouth daily.  Marland Kitchen ofloxacin (OCUFLOX) 0.3 % ophthalmic solution Administer 1 drop in the operative eye 4x daily after surgery  . ramipril (ALTACE) 5 MG capsule Take 5 mg by mouth daily.  . Travoprost, BAK Free, (TRAVATAN) 0.004 % SOLN ophthalmic solution 1 drop at bedtime.    Past Medical History:  Diagnosis Date  . Arthritis   . Breast cancer ([place]) 2001   left, invasive ductal carcinoma T1 N0 ER/PR positive  . Colitis March 2016  . Hypertension   . Personal history of malignant neoplasm of breast   . Personal history of radiation therapy     Past Surgical History:  Procedure Laterality Date  . BREAST BIOPSY Left 2001   +  . BREAST BIOPSY Left 2010   stereo scar tissure 2010  . BREAST EXCISIONAL BIOPSY Left 2001   + Radation  . BREAST LUMPECTOMY Left 2001  . COLONOSCOPY  2012   Dr. Vira Agar  . EYE SURGERY  2006, 2010   cataract  . TONSILLECTOMY  as child    Social History Social History   Tobacco Use  . Smoking status: Never Smoker  . Smokeless tobacco: Never Used  Substance Use Topics  . Alcohol  use: No  . Drug use: No    Family History Family History  Problem Relation Age of Onset  . Diabetes Father   . Cancer Mother   . Breast cancer Mother 38  . Breast cancer Sister 22  . Breast cancer Paternal Aunt 63    No Known Allergies   REVIEW OF SYSTEMS (Negative unless checked)  Constitutional: [] Weight loss  [] Fever  [] Chills Cardiac: [] Chest pain   [] Chest pressure   [] Palpitations   [] Shortness of breath when laying flat   [] Shortness of breath with exertion. Vascular:  [] Pain in legs with walking   [] Pain in legs at rest  [] History of DVT   [] Phlebitis   [x] Swelling in legs   [] Varicose veins   [] Non-healing ulcers Pulmonary:   [] Uses home oxygen    [] Productive cough   [] Hemoptysis   [] Wheeze  [] COPD   [] Asthma Neurologic:  [] Dizziness   [] Seizures   [] History of stroke   [] History of TIA  [] Aphasia   [] Vissual changes   [] Weakness or numbness in arm   [] Weakness or numbness in leg Musculoskeletal:   [] Joint swelling   [x] Joint pain   [] Low back pain Hematologic:  [] Easy bruising  [] Easy bleeding   [] Hypercoagulable state   [] Anemic Gastrointestinal:  [] Diarrhea   [] Vomiting  [] Gastroesophageal reflux/heartburn   [] Difficulty swallowing. Genitourinary:  [] Chronic kidney disease   [] Difficult urination  [] Frequent urination   [] Blood in urine Skin:  [] Rashes   [] Ulcers  Psychological:  [] History of anxiety   []  History of major depression.  Physical Examination  Vitals:   08/08/19 0939  BP: (!) 183/103  Pulse: 80  Resp: 16  Weight: 176 lb (79.8 kg)  Height: 5\' 4"  (1.626 m)   Body mass index is 30.21 kg/m. Gen: WD/WN, NAD Head: [place]/AT, No temporalis wasting.  Ear/Nose/Throat: Hearing grossly intact, nares w/o erythema or drainage Eyes: PER, EOMI, sclera nonicteric.  Neck: Supple, no large masses.   Pulmonary:  Good air movement, no audible wheezing bilaterally, no use of accessory muscles.  Cardiac: RRR, no JVD Vascular: scattered varicosities present bilaterally lower extremities.  Mild venous stasis changes to the legs bilaterally.  2+ soft pitting edema left arm Vessel Right Left  Radial Palpable Palpable  Gastrointestinal: Non-distended. No guarding/no peritoneal signs.  Musculoskeletal: M/S 5/5 throughout.  No deformity or atrophy.  Neurologic: CN 2-12 intact. Symmetrical.  Speech is fluent. Motor exam as listed above. Psychiatric: Judgment intact, Mood & affect appropriate for pt's clinical situation. Dermatologic: No rashes or ulcers noted.  No changes consistent with cellulitis.   CBC Lab Results  Component Value Date   WBC 4.8 09/04/2011   HGB 9.6 (L) 09/04/2011   HCT 27.7 (L) 09/04/2011   MCV 87 09/04/2011    PLT 152 09/04/2011    BMET    Component Value Date/Time   NA 143 09/05/2011 0701   K 4.6 09/05/2011 0701   CL 110 (H) 09/05/2011 0701   CO2 23 09/05/2011 0701   GLUCOSE 105 (H) 09/05/2011 0701   BUN 19 (H) 09/05/2011 0701   CREATININE 0.80 12/29/2017 1120   CREATININE 0.81 09/05/2011 0701   CALCIUM 9.4 09/05/2011 0701   GFRNONAA >60 09/05/2011 0701   GFRAA >60 09/05/2011 0701   CrCl cannot be calculated (Patient's most recent lab result is older than the maximum 21 days allowed.).  COAG No results found for: INR, PROTIME  Radiology No results found.   Assessment/Plan 1. Lymphedema No surgery or intervention at this point in  time.    I have reviewed my discussion with the patient regarding lymphedema and why it  causes symptoms.  Patient will continue wearing graduated compression sleeve on a daily basis. The patient is reminded to put the sleeve on first thing in the morning and removing them in the evening. The patient is instructed specifically not to sleep in the compression.   In addition, behavioral modification throughout the day will be continued.  This will include frequent elevation (such as elevation on 2-3 pillows), use of over the counter pain medications as needed and exercise such as walking.  The patient will continue aggressive use of the  lymph pump.  This will continue to improve the edema control and prevent sequela such as ulcers and infections and sarcoma.   The patient will follow-up with me on an annual basis.   2. Essential hypertension Continue antihypertensive medications as already ordered, these medications have been reviewed and there are no changes at this time.   3. Mixed hyperlipidemia Continue statin as ordered and reviewed, no changes at this time    Hortencia Pilar, MD  08/08/2019 9:51 AM

## 2019-11-06 IMAGING — CT CT CHEST W/ CM
2 of 3 series · 15 of 36 positions shown, 18 images · IV contrast (omnipaque)
Comparison: 12/21/2017 upper extremity ultrasound. 12/20/2017 chest
x-ray. No comparison chest CT. 02/28/2014 abdominal CT.

CLINICAL DATA: 86-year-old female with intermittent left arm
swelling for 2 weeks. Nodular density right lung apex seen on chest
x-ray. History of left-sided breast cancer post lumpectomy and
radiation. Subsequent encounter.

EXAM:
CT CHEST WITH CONTRAST
TECHNIQUE: Multidetector CT imaging of the chest was performed during
intravenous contrast administration.
CONTRAST:  75mL OMNIPAQUE IOHEXOL 300 MG/ML  SOLN

[Series 2: axial st · axial · 0.59mm/px · z∈[-226,-20]mm · 12 of 121 slices shown, 15 images]
[im 9/121  mediastinal]
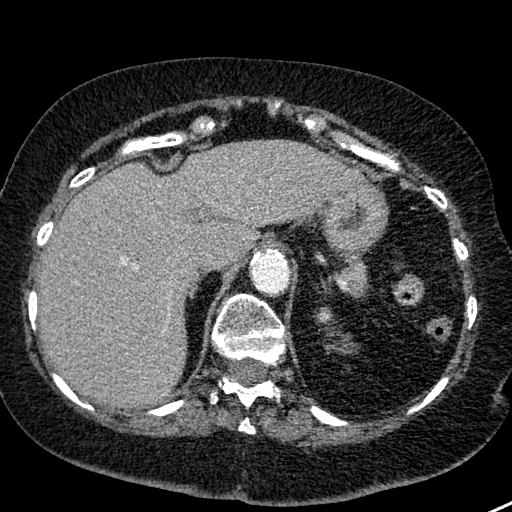
[im 9/121  lung]
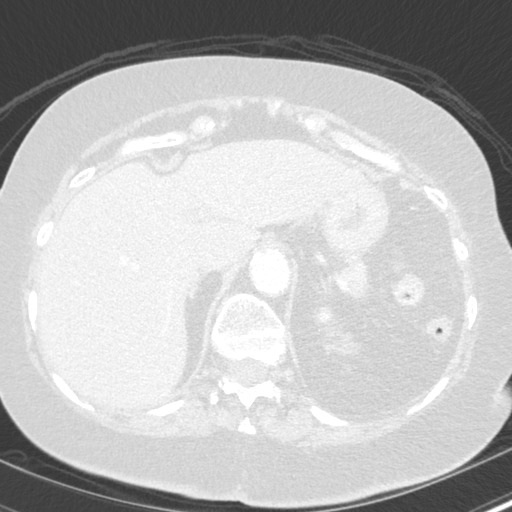
[im 18/121  lung]
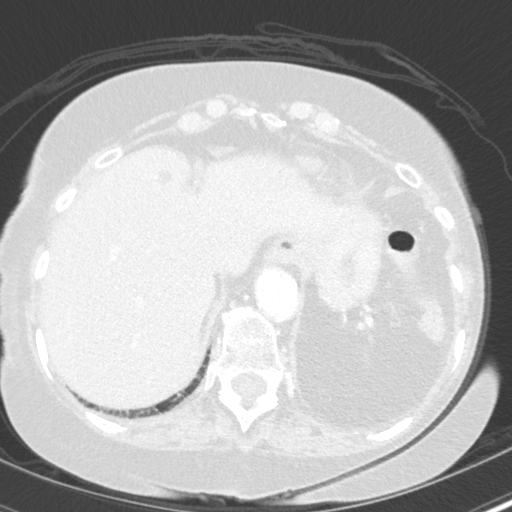
[im 27/121  lung]
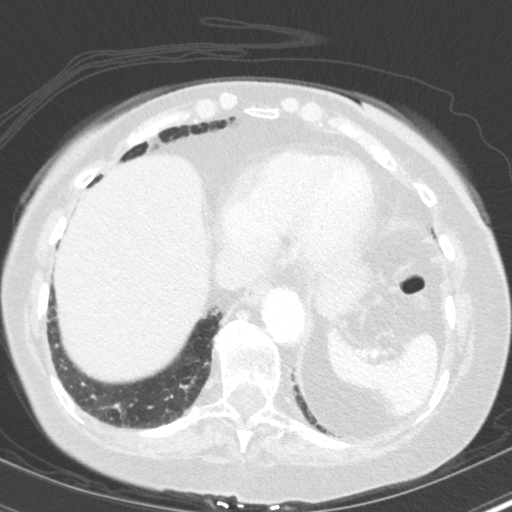
[im 36/121  lung]
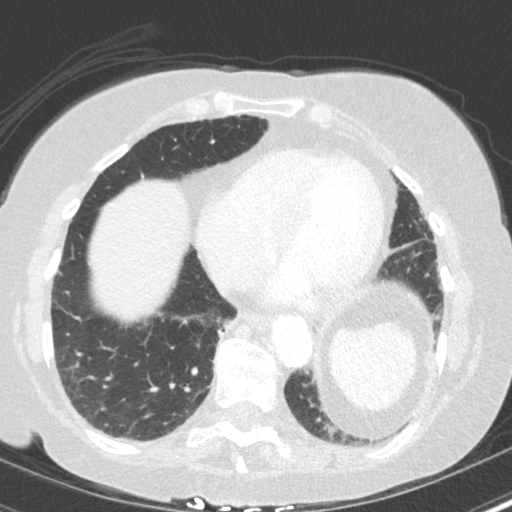
[im 45/121  mediastinal]
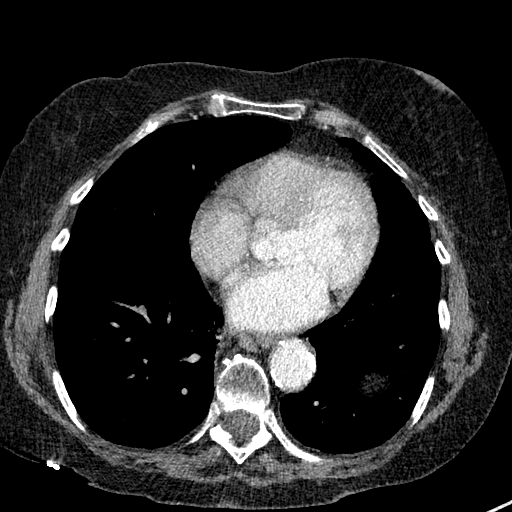
[im 45/121  lung]
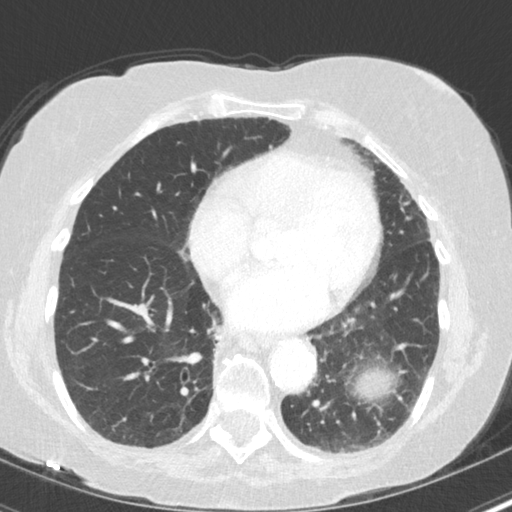
[im 54/121  lung]
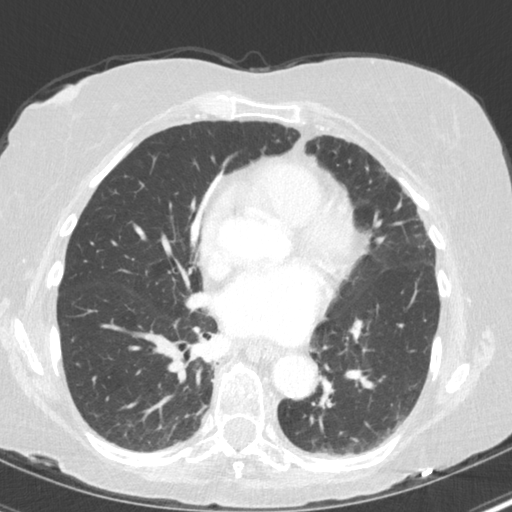
[im 67/121  lung]
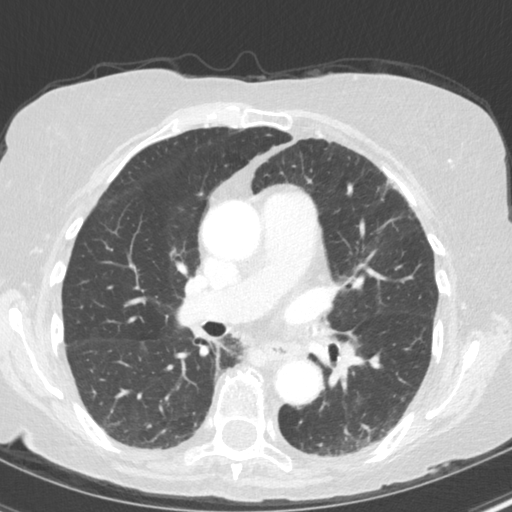
[im 76/121  lung]
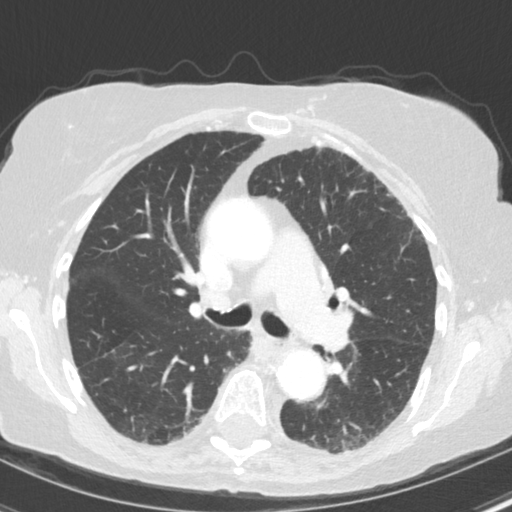
[im 85/121  mediastinal]
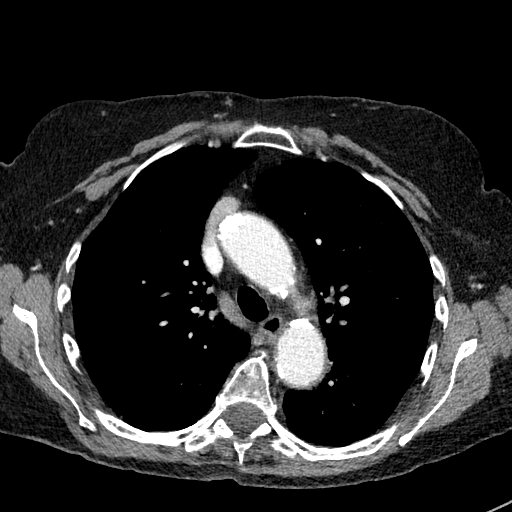
[im 85/121  lung]
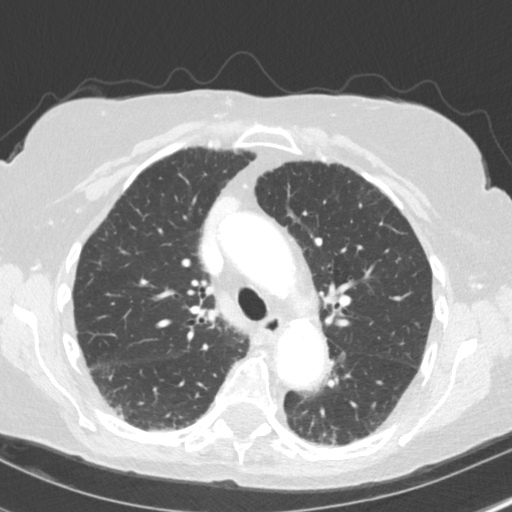
[im 94/121  lung]
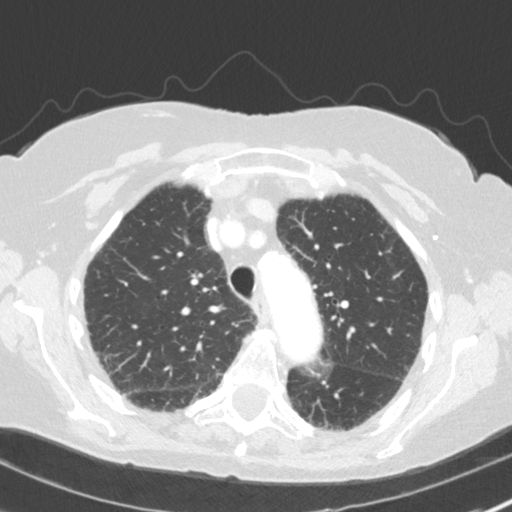
[im 103/121  lung]
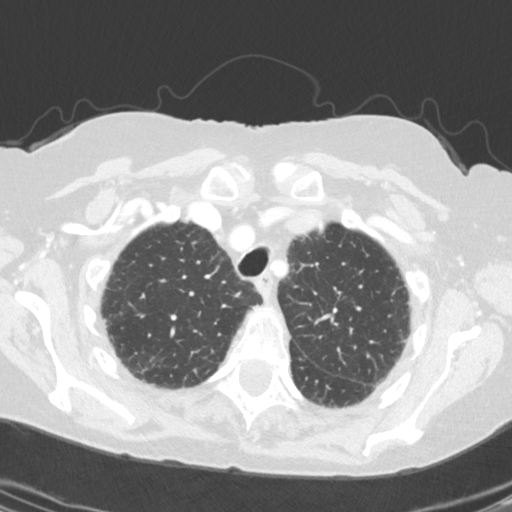
[im 112/121  lung]
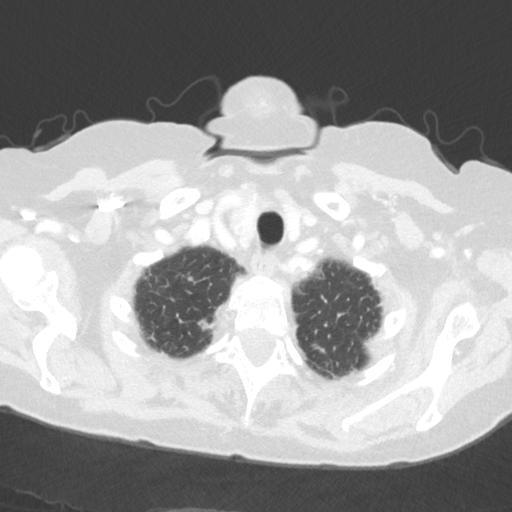

[Series 5: coronal · coronal · 0.51mm/px · 3 of 118 slices shown]
[im 24/118  lung]
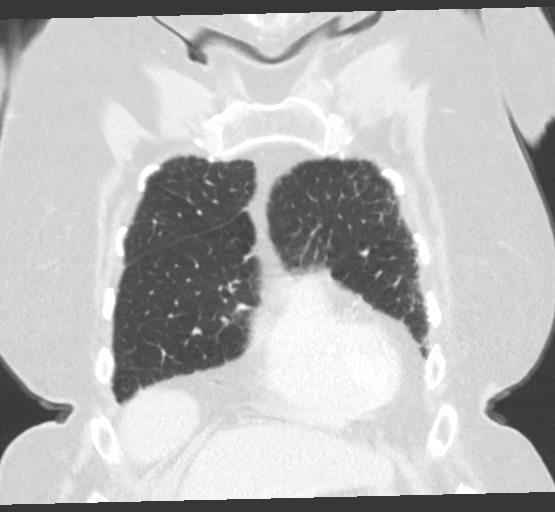
[im 47/118  lung]
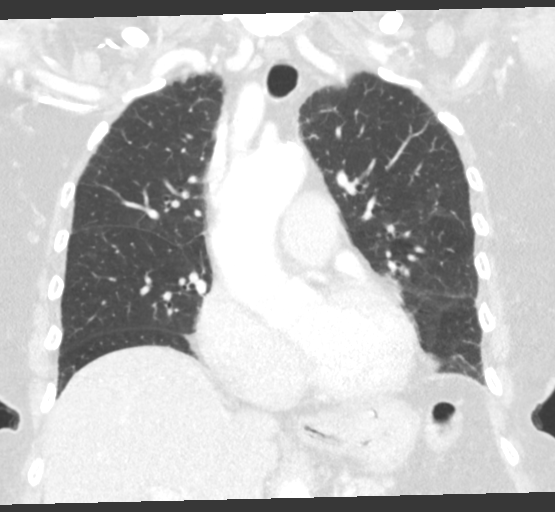
[im 71/118  lung]
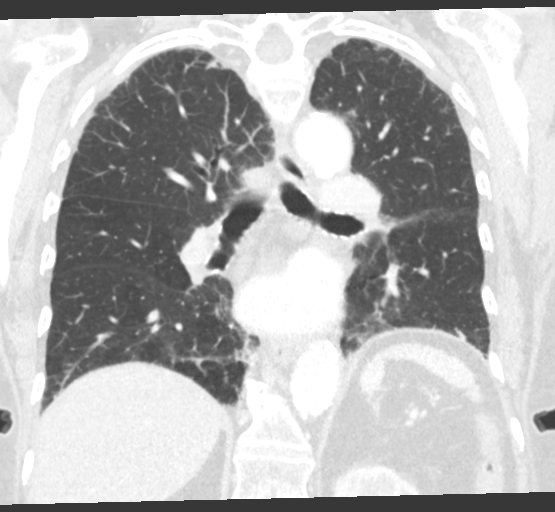

[15 of 36 positions shown; findings below may reference images not displayed]

FINDINGS: Cardiovascular: Heart size within normal limits. Mitral and aortic
valve calcification. Small amount of fluid superior pericardial
recess. Prominent epicardial fat with coarse calcification on the
right.

Atherosclerotic changes thoracic aorta and origin great vessels.
Ascending thoracic aorta measures up to 3.5 cm. Mild bulge posterior
aspect lower thoracic aorta with maximal AP dimension at this level
of 3 cm (series 6, images 7).

No central pulmonary embolus noted.

Narrowing of the left subclavian vein near the clavicle may be
related to positioning or stenosis (series 6, image 3). No thrombus
identified.

Mediastinum/Nodes: Scattered normal size mediastinal/hilar lymph
nodes. No worrisome thyroid mass noted. No obvious esophageal
abnormality.

Lungs/Pleura: Parenchymal changes throughout the lungs bilaterally.
In the right lung apex, some of the parenchymal changes appear
minimally nodular although suspicious for scarring than tumor. In
the left upper lobe, parenchymal changes suggestive of prior
radiation therapy. Trachea and mainstem bronchi are patent.

Upper Abdomen: Anterior left lobe liver subcentimeter low-density
structure probably a cyst and without significant change.

Musculoskeletal: Thoracic kyphoscoliosis with degenerative changes
without bony destructive lesion.
IMPRESSION: 1. Parenchymal changes throughout the lungs bilaterally. In the
right lung apex, some of the parenchymal changes appear minimally
nodular although more suspicious for scarring than tumor. In the
left upper lobe, parenchymal changes suggestive of response to
radiation therapy. Given patient's history of malignancy and lack of
prior chest CT, follow-up chest CT in 6 months may be considered to
confirm stability of these findings.
2. Aortic Atherosclerosis ([id number]-[id number]). Ascending thoracic aorta
measures up to 3.5 cm. Mild bulge posterior aspect lower thoracic
aorta with maximal AP dimension at this level of 3 cm (series 6,
images 7).
3. Narrowing of the left subclavian vein near the clavicle may be
related to positioning or stenosis (series 6, image 3). No thrombus
identified.

## 2020-04-27 ENCOUNTER — Other Ambulatory Visit: Payer: Self-pay

## 2020-04-27 ENCOUNTER — Ambulatory Visit (INDEPENDENT_AMBULATORY_CARE_PROVIDER_SITE_OTHER): Payer: Medicare PPO | Admitting: Vascular Surgery

## 2020-04-27 ENCOUNTER — Encounter (INDEPENDENT_AMBULATORY_CARE_PROVIDER_SITE_OTHER): Payer: Self-pay | Admitting: Vascular Surgery

## 2020-04-27 VITALS — BP 162/78 | HR 83 | Resp 16 | Wt 179.0 lb

## 2020-04-27 DIAGNOSIS — M7989 Other specified soft tissue disorders: Secondary | ICD-10-CM

## 2020-04-27 DIAGNOSIS — R600 Localized edema: Secondary | ICD-10-CM

## 2020-04-27 DIAGNOSIS — I10 Essential (primary) hypertension: Secondary | ICD-10-CM

## 2020-04-27 DIAGNOSIS — E782 Mixed hyperlipidemia: Secondary | ICD-10-CM

## 2020-04-27 DIAGNOSIS — M79602 Pain in left arm: Secondary | ICD-10-CM

## 2020-04-27 NOTE — Progress Notes (Signed)
MRN : [id number]  Olivia [name] is a 87 y.o. (04-28-1931) female who presents with chief complaint of left arm swelling and pain.  History of Present Illness:   The patient returns to the office for followup evaluation [place]. The swelling has improved quite a bit and the pain associated with swelling has decreased substantially, in fact she notes it is essentially resolved. There have not been any interval development of a ulcerations or wounds.  Is a new problem she is complaining of pain that seems to radiate from the shoulder down the backside of her arm she points to an area along the triceps region on the dorsal surface.  She describes it as a sharp pain seems to be worse with movement.  She was concerned this could be secondary to her lymphedema.  Since the previous visit the patient has been wearingagraduated compression sleeveand has noted significant improvement in the lymphedema. The patient has been using compression routinely.  The patient also states elevation during the day and exercise is being done too.  The patient denies problems with the pump, noting it is working well and the leggings are in good condition.  Since the previous visit the patient has been using the lymph pump on a routine basis and has noted significant improvement in the lymphedema.   Patient stated the lymph pump has been a very positive factor in her care.  Current Meds  Medication Sig  . acetaminophen (TYLENOL) 500 MG tablet Take 500 mg by mouth every 6 (six) hours as needed for pain.  Marland Kitchen atorvastatin (LIPITOR) 10 MG tablet Take 10 mg by mouth daily.  . calcium carbonate (OS-CAL - DOSED IN MG OF ELEMENTAL CALCIUM) 1250 (500 Ca) MG tablet Take 1 tablet by mouth.  . Calcium Carbonate-Vitamin D (CALCIUM + D PO) Take 1 tablet by mouth 2 (two) times daily.  . COMBIGAN 0.2-0.5 % ophthalmic solution Apply 1 drop to eye 2 (two) times daily.  . Cranberry 400 MG CAPS  Take by mouth.  . diclofenac sodium (VOLTAREN) 1 % GEL Apply 2 g topically 4 (four) times daily.  . furosemide (LASIX) 20 MG tablet Take 20 mg by mouth as needed.  Marland Kitchen ketorolac (ACULAR) 0.5 % ophthalmic solution Place 1 drop into the right eye 4 (four) times daily.  Marland Kitchen latanoprost (XALATAN) 0.005 % ophthalmic solution   . mometasone (NASONEX) 50 MCG/ACT nasal spray Place 2 sprays into the nose daily.  . Multiple Vitamin (MULTIVITAMIN) capsule Take 1 capsule by mouth daily.  Marland Kitchen ofloxacin (OCUFLOX) 0.3 % ophthalmic solution Administer 1 drop in the operative eye 4x daily after surgery  . ramipril (ALTACE) 5 MG capsule Take 5 mg by mouth daily.  . Travoprost, BAK Free, (TRAVATAN) 0.004 % SOLN ophthalmic solution 1 drop at bedtime.    Past Medical History:  Diagnosis Date  . Arthritis   . Breast cancer ([place]) 2001   left, invasive ductal carcinoma T1 N0 ER/PR positive  . Colitis March 2016  . Hypertension   . Personal history of malignant neoplasm of breast   . Personal history of radiation therapy     Past Surgical History:  Procedure Laterality Date  . BREAST BIOPSY Left 2001   +  . BREAST BIOPSY Left 2010   stereo scar tissure 2010  . BREAST EXCISIONAL BIOPSY Left 2001   + Radation  . BREAST LUMPECTOMY Left 2001  . COLONOSCOPY  2012   Dr. Vira Agar  . EYE SURGERY  2006, 2010  cataract  . TONSILLECTOMY  as child    Social History Social History   Tobacco Use  . Smoking status: Never Smoker  . Smokeless tobacco: Never Used  Substance Use Topics  . Alcohol use: No  . Drug use: No    Family History Family History  Problem Relation Age of Onset  . Diabetes Father   . Cancer Mother   . Breast cancer Mother 2  . Breast cancer Sister 56  . Breast cancer Paternal Aunt 79    No Known Allergies   REVIEW OF SYSTEMS (Negative unless checked)  Constitutional: [] Weight loss  [] Fever  [] Chills Cardiac: [] Chest pain   [] Chest pressure   [] Palpitations   [] Shortness of  breath when laying flat   [] Shortness of breath with exertion. Vascular:  [] Pain in legs with walking   [] Pain in legs at rest  [] History of DVT   [] Phlebitis   [] Swelling in legs   [] Varicose veins   [] Non-healing ulcers Pulmonary:   [] Uses home oxygen   [] Productive cough   [] Hemoptysis   [] Wheeze  [] COPD   [] Asthma Neurologic:  [] Dizziness   [] Seizures   [] History of stroke   [] History of TIA  [] Aphasia   [] Vissual changes   [x] Weakness or numbness in arm   [] Weakness or numbness in leg Musculoskeletal:   [] Joint swelling   [x] Joint pain   [] Low back pain Hematologic:  [] Easy bruising  [] Easy bleeding   [] Hypercoagulable state   [] Anemic Gastrointestinal:  [] Diarrhea   [] Vomiting  [] Gastroesophageal reflux/heartburn   [] Difficulty swallowing. Genitourinary:  [] Chronic kidney disease   [] Difficult urination  [] Frequent urination   [] Blood in urine Skin:  [] Rashes   [] Ulcers  Psychological:  [] History of anxiety   []  History of major depression.  Physical Examination  There were no vitals filed for this visit. There is no height or weight on file to calculate BMI. Gen: WD/WN, NAD Head: [place]/AT, No temporalis wasting.  Ear/Nose/Throat: Hearing grossly intact, nares w/o erythema or drainage Eyes: PER, EOMI, sclera nonicteric.  Neck: Supple, no large masses.   Pulmonary:  Good air movement, no audible wheezing bilaterally, no use of accessory muscles.  Cardiac: RRR, no JVD Vascular: Today her arms are fairly symmetric the left is only slightly larger than the right.  Her lymphedema appears to be extremely well controlled there are no skin changes noted there is no palpable cord or mass in the area of her pain on the left Vessel Right Left  Radial Palpable Palpable  Gastrointestinal: Non-distended. No guarding/no peritoneal signs.  Musculoskeletal: M/S 5/5 throughout.  No deformity or atrophy.  Neurologic: CN 2-12 intact. Symmetrical.  Speech is fluent. Motor exam as listed above. Psychiatric:  Judgment intact, Mood & affect appropriate for pt's clinical situation. Dermatologic: No rashes or ulcers noted.  No changes consistent with cellulitis. Lymph : No lichenification or skin changes of chronic lymphedema.  CBC Lab Results  Component Value Date   WBC 4.8 09/04/2011   HGB 9.6 (L) 09/04/2011   HCT 27.7 (L) 09/04/2011   MCV 87 09/04/2011   PLT 152 09/04/2011    BMET    Component Value Date/Time   NA 143 09/05/2011 0701   K 4.6 09/05/2011 0701   CL 110 (H) 09/05/2011 0701   CO2 23 09/05/2011 0701   GLUCOSE 105 (H) 09/05/2011 0701   BUN 19 (H) 09/05/2011 0701   CREATININE 0.80 12/29/2017 1120   CREATININE 0.81 09/05/2011 0701   CALCIUM 9.4 09/05/2011 0701   GFRNONAA >  60 09/05/2011 0701   GFRAA >60 09/05/2011 0701   CrCl cannot be calculated (Patient's most recent lab result is older than the maximum 21 days allowed.).  COAG No results found for: INR, PROTIME  Radiology No results found.   Assessment/Plan 1. Left upper extremity swelling No surgery or intervention at this point in time.   I have reviewed my discussion with the patient regarding lymphedema and why it causes symptoms. Patient will continue wearing graduated compression sleeveon a daily basis. The patient is reminded to put the sleeveon first thing in the morning and removing them in the evening. The patient is instructed specifically not to sleep in the compression.   In addition, behavioral modification throughout the day will be continued. This will include frequent elevation (such as elevation on 2-3 pillows), use of over the counter pain medications as needed and exercise such as walking.  The patient will continue aggressive use of the lymph pump. This will continue to improve the edema control and prevent sequela such as ulcers and infections and sarcoma.   The patient will follow-up with me on an annual basis.  2. Pain of left upper extremity I believe her pain symptoms are  unrelated to her lymphedema.  It is more likely this is a radicular pain secondary to degenerative changes of her C-spine or perhaps degenerative changes of her shoulder joint.  I have asked that she follow-up with her orthopedic surgeon to evaluate this  3. Essential hypertension Continue antihypertensive medications as already ordered, these medications have been reviewed and there are no changes at this time.   4. Mixed hyperlipidemia Continue statin as ordered and reviewed, no changes at this time    Hortencia Pilar, MD  04/27/2020 1:48 PM

## 2020-04-29 DIAGNOSIS — M79603 Pain in arm, unspecified: Secondary | ICD-10-CM | POA: Insufficient documentation

## 2020-07-27 ENCOUNTER — Ambulatory Visit (INDEPENDENT_AMBULATORY_CARE_PROVIDER_SITE_OTHER): Payer: Medicare PPO | Admitting: Vascular Surgery

## 2021-01-30 ENCOUNTER — Emergency Department: Payer: Medicare PPO

## 2021-01-30 ENCOUNTER — Observation Stay
Admission: EM | Admit: 2021-01-30 | Discharge: 2021-02-01 | Disposition: A | Discharge status: Home / Self Care | Payer: Medicare PPO | Attending: Internal Medicine | Admitting: Internal Medicine

## 2021-01-30 ENCOUNTER — Other Ambulatory Visit: Payer: Self-pay

## 2021-01-30 DIAGNOSIS — R42 Dizziness and giddiness: Secondary | ICD-10-CM | POA: Diagnosis not present

## 2021-01-30 DIAGNOSIS — N390 Urinary tract infection, site not specified: Secondary | ICD-10-CM

## 2021-01-30 DIAGNOSIS — Z20822 Contact with and (suspected) exposure to covid-19: Secondary | ICD-10-CM | POA: Diagnosis not present

## 2021-01-30 DIAGNOSIS — G9341 Metabolic encephalopathy: Secondary | ICD-10-CM | POA: Diagnosis not present

## 2021-01-30 DIAGNOSIS — I160 Hypertensive urgency: Secondary | ICD-10-CM | POA: Diagnosis not present

## 2021-01-30 DIAGNOSIS — R55 Syncope and collapse: Secondary | ICD-10-CM | POA: Insufficient documentation

## 2021-01-30 DIAGNOSIS — G459 Transient cerebral ischemic attack, unspecified: Secondary | ICD-10-CM

## 2021-01-30 DIAGNOSIS — I10 Essential (primary) hypertension: Secondary | ICD-10-CM | POA: Diagnosis not present

## 2021-01-30 DIAGNOSIS — R410 Disorientation, unspecified: Principal | ICD-10-CM

## 2021-01-30 LAB — URINALYSIS, COMPLETE (UACMP) WITH MICROSCOPIC
Bilirubin Urine: NEGATIVE
Glucose, UA: NEGATIVE mg/dL
Ketones, ur: NEGATIVE mg/dL
Nitrite: POSITIVE — AB
Protein, ur: NEGATIVE mg/dL
Specific Gravity, Urine: 1.008 (ref 1.005–1.030)
Squamous Epithelial / LPF: NONE SEEN (ref 0–5)
WBC, UA: >50 WBC/hpf — ABNORMAL HIGH (ref 0–5)
pH: 7 (ref 5.0–8.0)

## 2021-01-30 LAB — COMPREHENSIVE METABOLIC PANEL
ALT: 18 U/L (ref 0–44)
AST: 28 U/L (ref 15–41)
Albumin: 3.7 g/dL (ref 3.5–5.0)
Alkaline Phosphatase: 75 U/L (ref 38–126)
Anion gap: 9 (ref 5–15)
BUN: 16 mg/dL (ref 8–23)
CO2: 25 mmol/L (ref 22–32)
Calcium: 9 mg/dL (ref 8.9–10.3)
Chloride: 107 mmol/L (ref 98–111)
Creatinine, Ser: 0.76 mg/dL (ref 0.44–1.00)
GFR, Estimated: >60 mL/min (ref >60)
Glucose, Bld: 110 mg/dL — ABNORMAL HIGH (ref 70–99)
Potassium: 3.8 mmol/L (ref 3.5–5.1)
Sodium: 141 mmol/L (ref 135–145)
Total Bilirubin: 0.7 mg/dL (ref 0.3–1.2)
Total Protein: 6.7 g/dL (ref 6.5–8.1)

## 2021-01-30 LAB — CBC WITH DIFFERENTIAL/PLATELET
Abs Immature Granulocytes: 0.03 10*3/uL (ref 0.00–0.07)
Basophils Absolute: 0.1 10*3/uL (ref 0.0–0.1)
Basophils Relative: 1 %
Eosinophils Absolute: 0.3 10*3/uL (ref 0.0–0.5)
Eosinophils Relative: 3 %
HCT: 37.9 % (ref 36.0–46.0)
Hemoglobin: 12.5 g/dL (ref 12.0–15.0)
Immature Granulocytes: 0 %
Lymphocytes Relative: 30 %
Lymphs Abs: 2.7 10*3/uL (ref 0.7–4.0)
MCH: 29.8 pg (ref 26.0–34.0)
MCHC: 33 g/dL (ref 30.0–36.0)
MCV: 90.2 fL (ref 80.0–100.0)
Monocytes Absolute: 1 10*3/uL (ref 0.1–1.0)
Monocytes Relative: 10 %
Neutro Abs: 5.2 10*3/uL (ref 1.7–7.7)
Neutrophils Relative %: 56 %
Platelets: 280 10*3/uL (ref 150–400)
RBC: 4.2 MIL/uL (ref 3.87–5.11)
RDW: 14.6 % (ref 11.5–15.5)
WBC: 9.3 10*3/uL (ref 4.0–10.5)
nRBC: 0 % (ref 0.0–0.2)

## 2021-01-30 LAB — TROPONIN I (HIGH SENSITIVITY): Troponin I (High Sensitivity): 8 ng/L (ref <18)

## 2021-01-30 MED ORDER — LABETALOL HCL 5 MG/ML IV SOLN
10.0000 mg | INTRAVENOUS | Status: DC | PRN
  Administered 2021-01-31: 10 mg via INTRAVENOUS
  Filled 2021-01-30 (×2): qty 4

## 2021-01-30 MED ORDER — LABETALOL HCL 5 MG/ML IV SOLN
5.0000 mg | Freq: Once | INTRAVENOUS | Status: AC
  Administered 2021-01-30: 5 mg via INTRAVENOUS
  Filled 2021-01-30: qty 4

## 2021-01-30 NOTE — ED Provider Notes (Addendum)
[facility] Provider Note    Event Date/Time   First MD Initiated Contact with Patient 01/30/21 2110     (approximate)   History   Hypertension   HPI  Olivia [name] is a 88 y.o. female who EMS reports and patient confirms that she was at home with her husband.  She stood up got lightheaded almost fell over but did not sat down and became confused for several minutes could not answer questions.  Patient does not remember that part.  Patient is now back to baseline.  Does not have a headache or chest pain or any other complaints can answer questions normally and does not have any weakness or numbness.  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      Physical Exam   Triage Vital Signs: ED Triage Vitals  Enc Vitals Group     BP 01/30/21 2104 (!) 212/103     Pulse Rate 01/30/21 2106 (!) 105     Resp 01/30/21 2104 16     Temp --      Temp Source 01/30/21 2104 Oral     SpO2 01/30/21 2106 98 %     Weight 01/30/21 2105 175 lb (79.4 kg)     Height 01/30/21 2105 5\' 4"  (1.626 m)     Head Circumference --      Peak Flow --      Pain Score 01/30/21 2105 0     Pain Loc --      Pain Edu? --      Excl. in [place]? --     Most recent vital signs: Vitals:   01/30/21 2110 01/30/21 2155  BP:  (!) 197/116  Pulse:  96  Resp:  15  Temp: 98.2 F (36.8 C)   SpO2:  96%     General: Awake, alert, no distress.  CV:  Good peripheral perfusion.  Heart rate and rhythm no audible murmurs Resp:  Normal effort.  Lungs are clear Abd:  No distention.  Abdomen soft bowel sounds positive nontender Extremities with no edema Neuro: Cranial nerves II through XII are intact except: Patient has a old visual field cut on the right side with an irregular pupil they are due to surgery.   ED Results / Procedures / Treatments   Labs (all labs ordered are listed, but only abnormal results are displayed) Labs Reviewed  COMPREHENSIVE  METABOLIC PANEL - Abnormal; Notable for the following components:      Result Value   Glucose, Bld 110 (*)    All other components within normal limits  URINALYSIS, COMPLETE (UACMP) WITH MICROSCOPIC - Abnormal; Notable for the following components:   Color, Urine YELLOW (*)    APPearance CLOUDY (*)    Hgb urine dipstick SMALL (*)    Nitrite POSITIVE (*)    Leukocytes,Ua LARGE (*)    WBC, UA >50 (*)    Bacteria, UA RARE (*)    All other components within normal limits  RESP PANEL BY RT-PCR (FLU A&B, COVID) ARPGX2  CBC WITH DIFFERENTIAL/PLATELET  TROPONIN I (HIGH SENSITIVITY)  TROPONIN I (HIGH SENSITIVITY)     EKG  EKG read interpreted by me shows normal sinus rhythm rate of 98 normal axis slight ST segment depression inferiorly and in V6 and V5.   RADIOLOGY Chest x-ray read by radiology reviewed by me shows chronic interstitial changes. Head CT read by radiology reviewed by me shows no acute disease.  PROCEDURES:  Critical Care performed: Critical care time 20 minutes.  This includes evaluating the patient reviewing the patient's studies Briefly reviewing the old records and then discussing the patient with hospitalist. Procedures   MEDICATIONS ORDERED IN ED: Medications  labetalol (NORMODYNE) injection 10 mg (has no administration in time range)  labetalol (NORMODYNE) injection 5 mg (5 mg Intravenous Given 01/30/21 2113)     IMPRESSION / MDM / [place] / ED COURSE  I reviewed the triage vital signs and the nursing notes.                              Patient's symptoms are consistent with possible orthostatic changes are more likely TIA especially in view of her extreme hypertension.  She has no symptoms right now and has returned to baseline.  She did not have any chest pain or shortness of breath.  CT does not show any intracranial hemorrhage or signs of stroke.  Patient is not anemic.  She does not have an elevated troponin and her electrolytes are okay.   Urine does show a lot of white blood cells without any symptoms.  This could be asymptomatic bacteriuria. We will have to get the patient in the hospital to control her blood pressure and evaluate her for TIA.       FINAL CLINICAL IMPRESSION(S) / ED DIAGNOSES   Final diagnoses:  Dizziness  TIA (transient ischemic attack)     Rx / DC Orders   ED Discharge Orders     None        Note:  This document was prepared using Dragon voice recognition software and may include unintentional dictation errors.   Nena Polio, MD 01/30/21 2329    Nena Polio, MD 01/30/21 2330

## 2021-01-30 NOTE — ED Triage Notes (Signed)
Per ems pt with possible tia at 2000. Per emspt was dizzy and not responding to family appropriately. Pt is currently alert and oriented x4, moves all extremities, no sensory deficit noted. Per ems pt with blood pressure 242/160 on their arrival. Speech is clear.

## 2021-01-31 ENCOUNTER — Inpatient Hospital Stay: Payer: Medicare PPO

## 2021-01-31 ENCOUNTER — Encounter: Payer: Self-pay | Admitting: Internal Medicine

## 2021-01-31 ENCOUNTER — Inpatient Hospital Stay
Admit: 2021-01-31 | Discharge: 2021-01-31 | Disposition: A | Discharge status: Home / Self Care | Payer: Medicare PPO | Attending: Internal Medicine | Admitting: Internal Medicine

## 2021-01-31 DIAGNOSIS — N390 Urinary tract infection, site not specified: Secondary | ICD-10-CM

## 2021-01-31 DIAGNOSIS — I10 Essential (primary) hypertension: Secondary | ICD-10-CM

## 2021-01-31 DIAGNOSIS — I6521 Occlusion and stenosis of right carotid artery: Secondary | ICD-10-CM | POA: Insufficient documentation

## 2021-01-31 DIAGNOSIS — G9341 Metabolic encephalopathy: Secondary | ICD-10-CM | POA: Diagnosis present

## 2021-01-31 DIAGNOSIS — R410 Disorientation, unspecified: Secondary | ICD-10-CM

## 2021-01-31 DIAGNOSIS — I160 Hypertensive urgency: Secondary | ICD-10-CM | POA: Diagnosis not present

## 2021-01-31 DIAGNOSIS — N3001 Acute cystitis with hematuria: Secondary | ICD-10-CM | POA: Diagnosis not present

## 2021-01-31 DIAGNOSIS — H409 Unspecified glaucoma: Secondary | ICD-10-CM | POA: Insufficient documentation

## 2021-01-31 DIAGNOSIS — R55 Syncope and collapse: Secondary | ICD-10-CM

## 2021-01-31 DIAGNOSIS — G459 Transient cerebral ischemic attack, unspecified: Secondary | ICD-10-CM

## 2021-01-31 DIAGNOSIS — R42 Dizziness and giddiness: Secondary | ICD-10-CM

## 2021-01-31 LAB — RESP PANEL BY RT-PCR (FLU A&B, COVID) ARPGX2
Influenza A by PCR: NEGATIVE
Influenza B by PCR: NEGATIVE
SARS Coronavirus 2 by RT PCR: NEGATIVE

## 2021-01-31 LAB — LIPID PANEL
Cholesterol: 145 mg/dL (ref 0–200)
HDL: 49 mg/dL (ref >40)
LDL Cholesterol: 75 mg/dL (ref 0–99)
Total CHOL/HDL Ratio: 3 RATIO
Triglycerides: 105 mg/dL (ref <150)
VLDL: 21 mg/dL (ref 0–40)

## 2021-01-31 LAB — ECHOCARDIOGRAM COMPLETE
AR max vel: 1.51 cm2
AV Peak grad: 12.1 mmHg
Ao pk vel: 1.74 m/s
Area-P 1/2: 4.08 cm2
Height: 64 in
S' Lateral: 3.1 cm
Weight: 2800 oz

## 2021-01-31 LAB — TROPONIN I (HIGH SENSITIVITY): Troponin I (High Sensitivity): 13 ng/L (ref <18)

## 2021-01-31 MED ORDER — SODIUM CHLORIDE 0.9 % IV SOLN: INTRAVENOUS | Status: DC

## 2021-01-31 MED ORDER — FLUTICASONE PROPIONATE 50 MCG/ACT NA SUSP
1.0000 | Freq: Every day | NASAL | Status: DC
  Filled 2021-01-31: qty 16

## 2021-01-31 MED ORDER — ADULT MULTIVITAMIN W/MINERALS CH
1.0000 | ORAL_TABLET | Freq: Every day | ORAL | Status: DC
  Administered 2021-02-01: 09:00:00 1 via ORAL
  Filled 2021-01-31 (×2): qty 1

## 2021-01-31 MED ORDER — ACETAMINOPHEN 160 MG/5ML PO SOLN
650.0000 mg | ORAL | Status: DC | PRN
  Filled 2021-01-31: qty 20.3

## 2021-01-31 MED ORDER — ASPIRIN 300 MG RE SUPP: 300.0000 mg | Freq: Every day | RECTAL | Status: DC

## 2021-01-31 MED ORDER — TIMOLOL MALEATE 0.25 % OP SOLN
1.0000 [drp] | Freq: Two times a day (BID) | OPHTHALMIC | Status: DC
  Administered 2021-01-31 – 2021-02-01 (×2): 1 [drp] via OPHTHALMIC
  Filled 2021-01-31: qty 5

## 2021-01-31 MED ORDER — ASPIRIN 325 MG PO TABS
325.0000 mg | ORAL_TABLET | Freq: Every day | ORAL | Status: DC
  Administered 2021-01-31 – 2021-02-01 (×2): 325 mg via ORAL
  Filled 2021-01-31 (×3): qty 1

## 2021-01-31 MED ORDER — DICLOFENAC SODIUM 1 % EX GEL
2.0000 g | Freq: Four times a day (QID) | CUTANEOUS | Status: DC
  Administered 2021-01-31 – 2021-02-01 (×2): 2 g via TOPICAL
  Filled 2021-01-31: qty 100

## 2021-01-31 MED ORDER — AMLODIPINE BESYLATE 5 MG PO TABS
5.0000 mg | ORAL_TABLET | Freq: Every day | ORAL | Status: DC
  Administered 2021-01-31 – 2021-02-01 (×2): 5 mg via ORAL
  Filled 2021-01-31 (×2): qty 1

## 2021-01-31 MED ORDER — IOHEXOL 350 MG/ML SOLN
75.0000 mL | Freq: Once | INTRAVENOUS | Status: AC | PRN
  Administered 2021-01-31: 75 mL via INTRAVENOUS
  Filled 2021-01-31: qty 75

## 2021-01-31 MED ORDER — BRIMONIDINE TARTRATE 0.2 % OP SOLN
1.0000 [drp] | Freq: Two times a day (BID) | OPHTHALMIC | Status: DC
  Filled 2021-01-31: qty 5

## 2021-01-31 MED ORDER — SODIUM CHLORIDE 0.9 % IV SOLN
1.0000 g | INTRAVENOUS | Status: DC
  Administered 2021-01-31 – 2021-02-01 (×2): 1 g via INTRAVENOUS
  Filled 2021-01-31 (×2): qty 10
  Filled 2021-01-31: qty 1

## 2021-01-31 MED ORDER — RAMIPRIL 5 MG PO CAPS
5.0000 mg | ORAL_CAPSULE | Freq: Every day | ORAL | Status: DC
  Administered 2021-01-31 – 2021-02-01 (×2): 5 mg via ORAL
  Filled 2021-01-31 (×3): qty 1

## 2021-01-31 MED ORDER — STROKE: EARLY STAGES OF RECOVERY BOOK: Freq: Once | Status: AC

## 2021-01-31 MED ORDER — ACETAMINOPHEN 325 MG PO TABS
650.0000 mg | ORAL_TABLET | ORAL | Status: DC | PRN
  Administered 2021-01-31: 22:00:00 650 mg via ORAL
  Filled 2021-01-31: qty 2

## 2021-01-31 MED ORDER — TIMOLOL MALEATE 0.25 % OP SOLN
1.0000 [drp] | Freq: Two times a day (BID) | OPHTHALMIC | Status: DC
  Filled 2021-01-31: qty 5

## 2021-01-31 MED ORDER — LATANOPROST 0.005 % OP SOLN
1.0000 [drp] | Freq: Every day | OPHTHALMIC | Status: DC
  Filled 2021-01-31: qty 2.5

## 2021-01-31 MED ORDER — BRIMONIDINE TARTRATE 0.2 % OP SOLN
1.0000 [drp] | Freq: Two times a day (BID) | OPHTHALMIC | Status: DC
  Administered 2021-01-31 – 2021-02-01 (×2): 1 [drp] via OPHTHALMIC
  Filled 2021-01-31: qty 5

## 2021-01-31 MED ORDER — BRIMONIDINE TARTRATE-TIMOLOL 0.2-0.5 % OP SOLN
1.0000 [drp] | Freq: Two times a day (BID) | OPHTHALMIC | Status: DC
  Filled 2021-01-31: qty 5

## 2021-01-31 MED ORDER — CALCIUM CARBONATE 1250 (500 CA) MG PO TABS
1.0000 | ORAL_TABLET | Freq: Two times a day (BID) | ORAL | Status: DC
  Administered 2021-01-31 – 2021-02-01 (×2): 500 mg via ORAL
  Filled 2021-01-31 (×3): qty 1

## 2021-01-31 MED ORDER — ENOXAPARIN SODIUM 40 MG/0.4ML IJ SOSY
40.0000 mg | PREFILLED_SYRINGE | INTRAMUSCULAR | Status: DC
  Administered 2021-01-31: 40 mg via SUBCUTANEOUS
  Filled 2021-01-31: qty 0.4

## 2021-01-31 MED ORDER — LATANOPROST 0.005 % OP SOLN
1.0000 [drp] | Freq: Every day | OPHTHALMIC | Status: DC
  Administered 2021-01-31: 1 [drp] via OPHTHALMIC
  Filled 2021-01-31: qty 2.5

## 2021-01-31 MED ORDER — ACETAMINOPHEN 650 MG RE SUPP: 650.0000 mg | RECTAL | Status: DC | PRN

## 2021-01-31 MED ORDER — ATORVASTATIN CALCIUM 20 MG PO TABS
10.0000 mg | ORAL_TABLET | Freq: Every day | ORAL | Status: DC
  Administered 2021-02-01: 10 mg via ORAL
  Filled 2021-01-31 (×2): qty 1

## 2021-01-31 NOTE — Evaluation (Addendum)
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   Essential hypertension 02/03/2018   Hyperlipidemia 02/03/2018   Lymphedema 02/01/2018   Left upper extremity swelling 12/27/2017   SUI (stress urinary incontinence, female) 01/20/2015   Incomplete emptying of bladder 11/10/2014   Uterovaginal prolapse, incomplete 10/14/2014   Personal history of breast cancer 08/09/2012   Past Medical History:  Past Medical History:  Diagnosis Date   Arthritis    Breast cancer ([place]) 2001   left, invasive ductal carcinoma T1 N0 ER/PR positive   Colitis March 2016   Hypertension    Personal history of malignant neoplasm of breast    Personal history of radiation therapy    Past Surgical History:  Past Surgical History:  Procedure Laterality Date   BREAST BIOPSY Left 2001   +   BREAST BIOPSY Left 2010   stereo scar tissure 2010   BREAST EXCISIONAL BIOPSY Left 2001   + Radation   BREAST LUMPECTOMY Left 2001   COLONOSCOPY  2012   Dr. Vira Agar   EYE SURGERY  2006, 2010   cataract   TONSILLECTOMY  as child   HPI:  Per 21 H&P "Olivia [name] is a 88 y.o. female with medical history significant for HTN who was brought to the ED after an episode of lightheadedness on standing up with a near fall but was able to sit down.  The episode was followed by several minutes of confusion in which she was unable to answer questions.  She was back to baseline by arrival in the  ED.  She was previously in her usual state of health.  Denies recent nausea, vomiting, abdominal pain or diarrhea, cough or shortness of breath or chest pain.  She denies headache or one-sided numbness weakness or tingling     ED course: On arrival BP 212/103 with otherwise normal vitals  Blood work: CBC, CMP and troponin unremarkable  Urinalysis consistent with UTI     EKG, personally viewed and interpreted: Sinus at 98 with minimal ST depression anterolateral leads     Imaging: CT head nonacute  Chest x-ray no active disease     Patient treated with labetalol in the ED.  Hospitalist consulted for admission."   Assessment / Plan / Recommendation Clinical Impression  Pt seen for cognitive-linguistic evaluation. Evaluation completed via informal means and portions of Cognistat. Pt demonstrated intact primary language skills as well as functional cognitive-linguistic ability (e.g. attention, immediate/short term/working memory, verbal/non-verbal problem solving, abstract reasoning, insight). Pt's speech is fluent, appropriate, and without s/sx dysarthria or anomia. Per pt, pt is at cognitive-linguistic baseline.    SLP to sign off as pt has no acute SLP needs at this time.    Pt made aware of role of SLP, results of SLP assessment, and SLP POC. Pt verbalized understanding/agreement. RN also made aware of results, recommendations, and SLP POC.     SLP Assessment  SLP Recommendation/Assessment: Patient needs continued Speech Lanaguage Pathology Services SLP Visit Diagnosis: Cognitive communication deficit (Y30.160)    Recommendations for  follow up therapy are one component of a multi-disciplinary discharge planning process, led by the attending physician.  Recommendations may be updated based on patient status, additional functional criteria and insurance authorization.    Follow Up Recommendations  No SLP follow up    Assistance Recommended at Discharge   (defer to OT/PT)  Functional Status  Assessment Patient has not had a recent decline in their functional status     SLP Evaluation Cognition  Overall Cognitive Status: Within Functional Limits for tasks assessed Arousal/Alertness: Awake/alert Orientation Level: Oriented X4 Memory: Appears intact Awareness: Appears intact Problem Solving: Appears intact Safety/Judgment: Appears intact       Comprehension  Auditory Comprehension Overall Auditory Comprehension: Appears within functional limits for tasks assessed Yes/No Questions: Within Functional Limits Commands: Within Functional Limits Conversation: Complex [facility]) Reading Comprehension Reading Status: Not tested    Expression Expression Primary Mode of Expression: Verbal Verbal Expression Overall Verbal Expression: Appears within functional limits for tasks assessed Initiation: No impairment Automatic Speech:  (WFL) Repetition: No impairment Naming: No impairment Pragmatics: No impairment Written Expression Dominant Hand: Right Written Expression: Not tested   Oral / Motor  Oral Motor/Sensory Function Overall Oral Motor/Sensory Function: Within functional limits Motor Speech Overall Motor Speech: Appears within functional limits for tasks assessed Respiration: Within functional limits Phonation: Normal Resonance: Within functional limits Articulation: Within functional limitis Intelligibility: Intelligible Motor Planning: Witnin functional limits           Cherrie Gauze, M.S., [facility] [phone] ([place])   Quintella Baton 01/31/2021, 2:16 PM

## 2021-01-31 NOTE — Progress Notes (Signed)
Patient ID: Olivia [name], female   DOB: 07-23-1931, 88 y.o.   MRN: [id number] [facility] PROGRESS NOTE  Olivia [name] DOB: Sep 18, 1931 DOA: 01/30/2021 PCP: Albina Billet, MD  HPI/Subjective: The patient got up from her sofa and felt dizzy but did not fall.  She asked her husband to help her back to the sofa.  Her husband thought she was not answering questions correctly.  When EMS arrived her blood pressure was elevated.  3 weeks ago she was taken off her blood pressure medications.  Currently feels fine.  Blood pressure variable during the hospital course.  Objective: Vitals:   01/31/21 1200 01/31/21 1300  BP: (!) 193/89 (!) 158/80  Pulse: 83 87  Resp: 11 20  Temp:  97.7 F (36.5 C)  SpO2: 96% 99%    Intake/Output Summary (Last 24 hours) at 01/31/2021 1536 Last data filed at 01/31/2021 0426 Gross per 24 hour  Intake 146.52 ml  Output 700 ml  Net -553.48 ml   Filed Weights   01/30/21 2105  Weight: 79.4 kg    ROS: Review of Systems  Respiratory:  Negative for shortness of breath.   Cardiovascular:  Negative for chest pain.  Gastrointestinal:  Negative for abdominal pain, nausea and vomiting.  Exam: Physical Exam HENT:     Head: Normocephalic.     Mouth/Throat:     Pharynx: No oropharyngeal exudate.  Eyes:     General: Lids are normal.     Conjunctiva/sclera: Conjunctivae normal.  Cardiovascular:     Rate and Rhythm: Normal rate and regular rhythm.     Heart sounds: Normal heart sounds, S1 normal and S2 normal.  Pulmonary:     Breath sounds: No decreased breath sounds, wheezing, rhonchi or rales.  Abdominal:     Palpations: Abdomen is soft.     Tenderness: There is no abdominal tenderness.  Musculoskeletal:     Right lower leg: No swelling.     Left lower leg: No swelling.  Skin:    General: Skin is warm.     Findings: No rash.  Neurological:     Mental Status: She is alert and oriented to person, place, and time.     Comments:  Power 5 out of 5 bilateral upper and lower extremity      Scheduled Meds:  amLODipine  5 mg Oral Daily   aspirin  300 mg Rectal Daily   Or   aspirin  325 mg Oral Daily   atorvastatin  10 mg Oral Daily   brimonidine  1 drop Right Eye BID   And   timolol  1 drop Right Eye BID   calcium carbonate  1 tablet Oral BID WC   diclofenac Sodium  2 g Topical QID   enoxaparin (LOVENOX) injection  40 mg Subcutaneous Q24H   fluticasone  1 spray Each Nare Daily   latanoprost  1 drop Right Eye QHS   multivitamin with minerals  1 tablet Oral Daily   ramipril  5 mg Oral Daily   Continuous Infusions:  cefTRIAXone (ROCEPHIN)  IV Stopped (01/31/21 0408)    Assessment/Plan:  Acute metabolic encephalopathy.  Unclear if this is a TIA versus hypertensive encephalopathy versus acute cystitis causing this.  Mental status back to baseline. Accelerated hypertension.  Patient placed back on Norvasc and ramipril.  We will watch blood pressure another day here in the hospital.  Last blood pressure 158/80.  Previous blood pressure 193/89. Possible TIA.  Started on aspirin already on  atorvastatin.  LDL 75. Acute cystitis with hematuria on Rocephin.  Follow-up urine culture. Right carotid stenosis.  CT angiogram showed 60% stenosis on the right side.  We will have to follow-up as outpatient. Glaucoma unspecified continue eyedrops History of breast cancer  Code Status:     Code Status Orders  (From admission, onward)           Start     Ordered   01/31/21 0010  Full code  Continuous        01/31/21 0011           Code Status History     This patient has a current code status but no historical code status.       Disposition Plan: Status is: Inpatient  Likely discharge tomorrow if blood pressure stabilizes.  [place]  [facility] [employer]

## 2021-01-31 NOTE — H&P (Signed)
History and Physical    Olivia [name] GBT:[id number] DOB: 06-Apr-1931 DOA: 01/30/2021  PCP: Albina Billet, MD   Patient coming from: home  I have personally briefly reviewed patient's relevant medical records in [place]  Chief Complaint: near fall  HPI: Olivia [name] is a 88 y.o. female with medical history significant for HTN who was brought to the ED after an episode of lightheadedness on standing up with a near fall but was able to sit down.  The episode was followed by several minutes of confusion in which she was unable to answer questions.  She was back to baseline by arrival in the ED.  She was previously in her usual state of health.  Denies recent nausea, vomiting, abdominal pain or diarrhea, cough or shortness of breath or chest pain.  She denies headache or one-sided numbness weakness or tingling  ED course: On arrival BP 212/103 with otherwise normal vitals Blood work: CBC, CMP and troponin unremarkable Urinalysis consistent with UTI  EKG, personally viewed and interpreted: Sinus at 98 with minimal ST depression anterolateral leads  Imaging: CT head nonacute Chest x-ray no active disease  Patient treated with labetalol in the ED.  Hospitalist consulted for admission.   Review of Systems: As per HPI otherwise all other systems on review of systems negative.   Assessment/Plan  Presyncope with episode of confusion -Etiology possibly related to UTI, hypertensive urgency, possible TIA - Neurologic checks - Orthostatic checks - Gentle IV hydration  Possible TIA - Patient back to baseline - We will get MRI to evaluate for CVA - Stroke work-up to include continuous cardiac monitoring, echocardiogram and carotid Dopplers - Aspirin 325 - Continue atorvastatin and a daily aspirin - Permissive hypertension    Hypertensive urgency - Permissive hypertension until stroke ruled out then resume home antihypertensives and adjust to BP control    UTI (urinary  tract infection) - Rocephin - Follow cultures   DVT prophylaxis: Lovenox  Code Status: full code  Family Communication:  none  Disposition Plan: Back to previous home environment Consults called: none  Status:At the time of admission, it appears that the appropriate admission status for this patient is INPATIENT. This is judged to be reasonable and necessary in order to provide the required intensity of service to ensure the patient's safety given the presenting symptoms, physical exam findings, and initial radiographic and laboratory data in the context of their  Comorbid conditions.   Patient requires inpatient status due to high intensity of service, high risk for further deterioration and high frequency of surveillance required.   I certify that at the point of admission it is my clinical judgment that the patient will require inpatient hospital care spanning beyond 2 midnights     Physical Exam: Vitals:   01/30/21 2300 01/30/21 2315 01/30/21 2330 01/30/21 2345  BP: (!) 185/87 (!) 189/86 (!) 185/79 (!) 185/87  Pulse: 92 93 95 96  Resp: 16 17 15 18   Temp:      TempSrc:      SpO2: 95% 93% 96% 97%  Weight:      Height:       Constitutional: Alert, oriented x 3 . Not in any apparent distress HEENT:      Head: Normocephalic and atraumatic.         Eyes: PERLA, EOMI, Conjunctivae are normal. Sclera is non-icteric.       Mouth/Throat: Mucous membranes are moist.       Neck: Supple with no signs  of meningismus. Cardiovascular: Regular rate and rhythm. No murmurs, gallops, or rubs. 2+ symmetrical distal pulses are present . No JVD. No  LE edema Respiratory: Respiratory effort normal .Lungs sounds clear bilaterally. No wheezes, crackles, or rhonchi.  Gastrointestinal: Soft, non tender, non distended. Positive bowel sounds.  Genitourinary: No CVA tenderness. Musculoskeletal: Nontender with normal range of motion in all extremities. No cyanosis, or erythema of extremities. Neurologic:   Face is symmetric. Moving all extremities. No gross focal neurologic deficits . Skin: Skin is warm, dry.  No rash or ulcers Psychiatric: Mood and affect are appropriate     Past Medical History:  Diagnosis Date   Arthritis    Breast cancer ([place]) 2001   left, invasive ductal carcinoma T1 N0 ER/PR positive   Colitis March 2016   Hypertension    Personal history of malignant neoplasm of breast    Personal history of radiation therapy     Past Surgical History:  Procedure Laterality Date   BREAST BIOPSY Left 2001   +   BREAST BIOPSY Left 2010   stereo scar tissure 2010   BREAST EXCISIONAL BIOPSY Left 2001   + Radation   BREAST LUMPECTOMY Left 2001   COLONOSCOPY  2012   Dr. Vira Agar   EYE SURGERY  2006, 2010   cataract   TONSILLECTOMY  as child     reports that she has never smoked. She has never used smokeless tobacco. She reports that she does not drink alcohol and does not use drugs.  No Known Allergies  Family History  Problem Relation Age of Onset   Diabetes Father    Cancer Mother    Breast cancer Mother 56   Breast cancer Sister 75   Breast cancer Paternal Aunt 31     Prior to Admission medications   Medication Sig Start Date End Date Taking? Authorizing Provider  acetaminophen (TYLENOL) 500 MG tablet Take 500 mg by mouth every 6 (six) hours as needed for pain.    [provider]  atorvastatin (LIPITOR) 10 MG tablet Take 10 mg by mouth daily.    [provider]  calcium carbonate (OS-CAL - DOSED IN MG OF ELEMENTAL CALCIUM) 1250 (500 Ca) MG tablet Take 1 tablet by mouth.    [provider]  Calcium Carbonate-Vitamin D (CALCIUM + D PO) Take 1 tablet by mouth 2 (two) times daily.    [provider]  COMBIGAN 0.2-0.5 % ophthalmic solution Apply 1 drop to eye 2 (two) times daily. 07/29/19   [provider]  Cranberry 400 MG CAPS Take by mouth.    [provider]  diclofenac sodium (VOLTAREN) 1 % GEL Apply 2 g  topically 4 (four) times daily.    [provider]  furosemide (LASIX) 20 MG tablet Take 20 mg by mouth as needed.    [provider]  ketorolac (ACULAR) 0.5 % ophthalmic solution Place 1 drop into the right eye 4 (four) times daily. Patient not taking: Reported on 04/27/2020 07/31/19   [provider]  latanoprost (XALATAN) 0.005 % ophthalmic solution  04/09/19   [provider]  mometasone (NASONEX) 50 MCG/ACT nasal spray Place 2 sprays into the nose daily.    [provider]  Multiple Vitamin (MULTIVITAMIN) capsule Take 1 capsule by mouth daily.    [provider]  ofloxacin (OCUFLOX) 0.3 % ophthalmic solution Administer 1 drop in the operative eye 4x daily after surgery Patient not taking: Reported on 04/27/2020 06/18/19   [provider]  ramipril (ALTACE) 5 MG capsule Take 5 mg by mouth daily.    [provider]  Travoprost, BAK Free, (TRAVATAN) 0.004 % SOLN ophthalmic solution 1 drop at bedtime.    [provider]      Labs on Admission: I have personally reviewed following labs and imaging studies  CBC: Recent Labs  Lab 01/30/21 2109  WBC 9.3  NEUTROABS 5.2  HGB 12.5  HCT 37.9  MCV 90.2  PLT 546   Basic Metabolic Panel: Recent Labs  Lab 01/30/21 2109  NA 141  K 3.8  CL 107  CO2 25  GLUCOSE 110*  BUN 16  CREATININE 0.76  CALCIUM 9.0   GFR: Estimated Creatinine Clearance: 50.5 mL/min (by C-G formula based on SCr of 0.76 mg/dL). Liver Function Tests: Recent Labs  Lab 01/30/21 2109  AST 28  ALT 18  ALKPHOS 75  BILITOT 0.7  PROT 6.7  ALBUMIN 3.7   No results for input(s): LIPASE, AMYLASE in the last 168 hours. No results for input(s): AMMONIA in the last 168 hours. Coagulation Profile: No results for input(s): INR, PROTIME in the last 168 hours. Cardiac Enzymes: No results for input(s): CKTOTAL, CKMB, CKMBINDEX, TROPONINI in the last 168 hours. BNP (last 3 results) No results for  input(s): PROBNP in the last 8760 hours. HbA1C: No results for input(s): HGBA1C in the last 72 hours. CBG: No results for input(s): GLUCAP in the last 168 hours. Lipid Profile: No results for input(s): CHOL, HDL, LDLCALC, TRIG, CHOLHDL, LDLDIRECT in the last 72 hours. Thyroid Function Tests: No results for input(s): TSH, T4TOTAL, FREET4, T3FREE, THYROIDAB in the last 72 hours. Anemia Panel: No results for input(s): VITAMINB12, FOLATE, FERRITIN, TIBC, IRON, RETICCTPCT in the last 72 hours. Urine analysis:    Component Value Date/Time   COLORURINE YELLOW (A) 01/30/2021 2200   APPEARANCEUR CLOUDY (A) 01/30/2021 2200   APPEARANCEUR Cloudy 09/04/2011 2221   LABSPEC 1.008 01/30/2021 2200   LABSPEC 1.010 09/04/2011 2221   PHURINE 7.0 01/30/2021 2200   GLUCOSEU NEGATIVE 01/30/2021 2200   GLUCOSEU Negative 09/04/2011 2221   HGBUR SMALL (A) 01/30/2021 2200   BILIRUBINUR NEGATIVE 01/30/2021 2200   BILIRUBINUR Negative 09/04/2011 2221   KETONESUR NEGATIVE 01/30/2021 2200   PROTEINUR NEGATIVE 01/30/2021 2200   NITRITE POSITIVE (A) 01/30/2021 2200   LEUKOCYTESUR LARGE (A) 01/30/2021 2200   LEUKOCYTESUR 3+ 09/04/2011 2221    Radiological Exams on Admission: DG Chest 1 View  Result Date: 01/30/2021 CLINICAL DATA:  Hypertension, dizziness, altered mental status EXAM: CHEST  1 VIEW COMPARISON:  None. FINDINGS: Chronic interstitial thickening noted. No superimposed confluent pulmonary infiltrate. No pneumothorax or pleural effusion. Cardiac size within normal limits. Pulmonary vascularity is normal. Surgical clips seen within the left axilla. No acute bone abnormality. IMPRESSION: No active disease. Electronically Signed   By: Fidela Salisbury M.D.   On: 01/30/2021 21:29   CT HEAD WO CONTRAST (5MM)  Result Date: 01/30/2021 CLINICAL DATA:  Dizziness EXAM: CT HEAD WITHOUT CONTRAST TECHNIQUE: Contiguous axial images were obtained from the base of the skull through the vertex without intravenous  contrast. RADIATION DOSE REDUCTION: This exam was performed according to the departmental dose-optimization program which includes automated exposure control, adjustment of the mA and/or kV according to patient size and/or use of iterative reconstruction technique. COMPARISON:  None. FINDINGS: Brain: No acute territorial infarction, hemorrhage or intracranial mass. Moderate atrophy. Mild chronic small vessel ischemic changes of the white matter. The ventricles are nonenlarged Vascular: No hyperdense vessels.  Carotid vascular  calcification Skull: Normal. Negative for fracture or focal lesion. Sinuses/Orbits: No acute finding. Other: None IMPRESSION: 1. No CT evidence for acute intracranial abnormality. 2. Atrophy and chronic small vessel ischemic changes of the white matter. Electronically Signed   By: Donavan Foil M.D.   On: 01/30/2021 21:56       Athena Masse MD [facility]   01/31/2021, 12:18 AM

## 2021-01-31 NOTE — Evaluation (Signed)
Occupational Therapy Evaluation Patient Details Name: Olivia [name] MRN: [id number] DOB: 1931-01-16 Today's Date: 01/31/2021   History of Present Illness Pt is an 88 y/o F admitted on 01/30/21 with c/c of near fall. Pt had an episode of lightheadedness on standing with near fall but able to sit down; episode was follows by several minutes of confusion but pt was back to baseline upon arrival to ED. Pt is being treated for presyncope with episode of confusion, etiology possibly related to UTI, hypertensive urgency, possible TIA. 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 PMH : HTN   Clinical Impression   Pt seen for OT evaluation this date. Prior to hospital admission, pt was independent in all aspects of ADL/IADL, using a SPC for all mobility, and denies falls history in past 12 months. Pt lives with her spouse in a multi-level home (able to live on main floor, if needed) with 4 steps to enter. Pt performed bed mobility MOD-I and performed functional mobility of short household distances, BSC transfer, and standing hand hygiene with SUPERVISION only d/t pt positive for orthostatic hypotension (however asymptomatic); RN informed. Pt also engaged in IADL task of changing bed sheets, which required pt to reach outside BOS, requiring only SUPERVISION (no LOB observed). Pt currently reports being close to baseline independence to perform ADLs and mobility tasks, and no strength, sensory, coordination, cognitive, or visual deficits appreciated with assessment. No additional skilled OT needs identified at this time. Will sign off. Please re-consult if additional OT needs arise.    Orthostatic VS for the past 24 hrs (Last 3 readings):  BP- Lying BP- Sitting BP- Standing at 0 minutes BP- Standing at 3 minutes  01/31/21 1015 166/82 166/84 134/80 143/87      Recommendations for follow up therapy are one component of a multi-disciplinary discharge planning  process, led by the attending physician.  Recommendations may be updated based on patient status, additional functional criteria and insurance authorization.   Follow Up Recommendations  No OT follow up    Assistance Recommended at Discharge Intermittent Supervision/Assistance  Patient can return home with the following Assistance with cooking/housework    Functional Status Assessment  Patient has had a recent decline in their functional status and demonstrates the ability to make significant improvements in function in a reasonable and predictable amount of time.  Equipment Recommendations  None recommended by OT       Precautions / Restrictions Precautions Precautions: Fall Restrictions Weight Bearing Restrictions: No      Mobility Bed Mobility Overal bed mobility: Modified Independent Bed Mobility: Supine to Sit     Supine to sit: Modified independent (Device/Increase time)          Transfers Overall transfer level: Needs assistance Equipment used: Straight cane Transfers: Sit to/from Stand Sit to Stand: Supervision                  Balance Overall balance assessment: Needs assistance Sitting-balance support: No upper extremity supported, Feet unsupported Sitting balance-Leahy Scale: Good     Standing balance support: Single extremity supported Standing balance-Leahy Scale: Good Standing balance comment: Able to reach outside BOS to assist with changing bed linens                           ADL either performed or assessed with clinical judgement   ADL Overall ADL's : Needs assistance/impaired     Grooming: Wash/dry hands;Supervision/safety;Standing  Toilet Transfer: Supervision/safety;BSC/3in1;Ambulation           Functional mobility during ADLs: Supervision/safety       Vision Patient Visual Report: No change from baseline              Pertinent Vitals/Pain Pain Assessment Pain Assessment:  No/denies pain     Hand Dominance Right   Extremity/Trunk Assessment Upper Extremity Assessment Upper Extremity Assessment: Overall WFL for tasks assessed   Lower Extremity Assessment Lower Extremity Assessment: Overall WFL for tasks assessed       Communication Communication Communication: No difficulties   Cognition Arousal/Alertness: Awake/alert Behavior During Therapy: WFL for tasks assessed/performed Overall Cognitive Status: Within Functional Limits for tasks assessed                                 General Comments: A&Ox4     General Comments  BP supine: 166/82; BP seated EOB: 166/84; BP standing 134/80; BP seated following functional mobility 143/87. Pt denying dizziness throughout    Exercises Other Exercises Other Exercises: Pt engaged in IADL task of changing bed linen; requiring only supervision when reaching outside [place] expects to be discharged to:: Private residence Living Arrangements: Spouse/significant other;Children;Other (Comment) (son & grandson live close by) Available Help at Discharge: Family;Available 24 hours/day Type of Home: House Home Access: Stairs to enter [employer] of Steps: 4 Entrance Stairs-Rails: Can reach both;Right;Left Home Layout: Multi-level;Able to live on main level with bedroom/bathroom (but does go to 2nd level 2x/day)     Bathroom Shower/Tub: Walk-in shower         Home Equipment: Grab bars - tub/shower;Hand held shower head;Shower seat - built in;Cane - single point          Prior Functioning/Environment Prior Level of Function : Independent/Modified Independent             Mobility Comments: intermittent use of SPC for functional mobility. No falls this year ADLs Comments: Independent with ADLs. Cooks and does laundry. Housekeeping comes 1x for cleaning        OT Problem List: Impaired balance (sitting and/or standing)      OT  Treatment/Interventions:      OT Goals(Current goals can be found in the care plan section) Acute Rehab OT Goals OT Goal Formulation: All assessment and education complete, DC therapy  OT Frequency:         AM-PAC OT "6 Clicks" Daily Activity     Outcome Measure Help from another person eating meals?: None Help from another person taking care of personal grooming?: A Little Help from another person toileting, which includes using toliet, bedpan, or urinal?: A Little Help from another person bathing (including washing, rinsing, drying)?: A Little Help from another person to put on and taking off regular upper body clothing?: None Help from another person to put on and taking off regular lower body clothing?: A Little 6 Click Score: 20   End of Session Equipment Utilized During Treatment: Other (comment) [facility]) Nurse Communication: Mobility status;Other (comment) (BP vitals with change in position)  Activity Tolerance: Patient tolerated treatment well Patient left: with call bell/phone within reach;in bed;Other (comment) (with PT)  OT Visit Diagnosis: Unsteadiness on feet (R26.81)                Time: 1010-1025 OT Time Calculation (min): 15 min Charges:  OT General Charges $OT Visit:  1 Visit OT Evaluation $OT Eval Moderate Complexity: [place], OTR/L [place]

## 2021-01-31 NOTE — Progress Notes (Signed)
SLP Cancellation Note  Patient Details Name: Olivia [name] MRN: [id number] DOB: 10-07-1931   Cancelled treatment:       Reason Eval/Treat Not Completed: Medical issues which prohibited therapy;Patient at procedure or test/unavailable.   SLP consult received and appreciated. Chart review completed. Cognitive-linguistic evaluation unable to be completed at this time as pt undergoing carotid US.  Will continue efforts as appropriate.    Cherrie Gauze, M.S., [facility] [phone] ([place])   Quintella Baton 01/31/2021, 8:20 AM

## 2021-01-31 NOTE — Evaluation (Signed)
Physical Therapy Evaluation Patient Details Name: JENNAFER GLADUE MRN: [id number] DOB: [birth year] Today's Date: 01/31/2021  History of Present Illness  Pt is an 88 y/o F admitted on 01/30/21 with c/c of near fall. Pt had an episode of lightheadedness on standing with near fall but able to sit down; episode was follows by several minutes of confusion but pt was back to baseline upon arrival to ED. Pt is being treated for presyncope with episode of confusion, etiology possibly related to UTI, hypertensive urgency, possible TIA. MRI was negative for acute intracranial abnormality. PMH: HTN  Clinical Impression  Pt received in handoff from OT with OT reporting pt is + for orthostatic hypotension but asymptomatic; pt without c/o symptoms during session. Pt very pleasant & agreeable to tx. Pt reports she uses SPC PRN at home, but ambulates without it more often than with. Pt also notes she was doing exercises classes she watched on her iPad until it broke recently. On this date, pt was able to ambulate increased distances with SPC with mod I without overt LOB. Pt demonstrates good awareness of energy & need to return to room to rest. At this time, pt reports being very close to her baseline level of function. No acute PT needs at this time & PT to sign off. Please re-consult if new needs arise.        Recommendations for follow up therapy are one component of a multi-disciplinary discharge planning process, led by the attending physician.  Recommendations may be updated based on patient status, additional functional criteria and insurance authorization.  Follow Up Recommendations No PT follow up    Assistance Recommended at Discharge PRN  Patient can return home with the following       Equipment Recommendations None recommended by PT  Recommendations for Other Services       Functional Status Assessment Patient has not had a recent decline in their functional status     Precautions /  Restrictions Precautions Precautions: Fall Restrictions Weight Bearing Restrictions: No      Mobility  Bed Mobility Overal bed mobility: Needs Assistance, Modified Independent Bed Mobility: Sit to Supine       Sit to supine: Modified independent (Device/Increase time), HOB elevated        Transfers Overall transfer level: Modified independent Equipment used: Straight cane               General transfer comment: sit<>stand without assistance    Ambulation/Gait Ambulation/Gait assistance: Modified independent (Device/Increase time) Gait Distance (Feet): 100 Feet Assistive device: Straight cane Gait Pattern/deviations: Decreased step length - right, Decreased step length - left, Decreased stride length Gait velocity: slightly decreased        Stairs            Wheelchair Mobility    Modified Rankin (Stroke Patients Only)       Balance Overall balance assessment: Mild deficits observed, not formally tested (no overt LOB with gait with SPC)                                           Pertinent Vitals/Pain Pain Assessment Pain Assessment: No/denies pain    Home Living Family/patient expects to be discharged to:: Private residence Living Arrangements: Spouse/significant other;Children;Other (Comment) (son & grandson live close by) Available Help at Discharge: Family;Available 24 hours/day Type of Home: House Home Access: Stairs to enter  Entrance Stairs-Rails: Can reach both;Right;Left Entrance Stairs-Number of Steps: 4   Home Layout: Multi-level;Able to live on main level with bedroom/bathroom (but does go to 2nd level 2x/day) Home Equipment: Grab bars - tub/shower;Hand held shower head;Shower seat - built in;Cane - single point      Prior Function Prior Level of Function : Independent/Modified Independent             Mobility Comments: intermittent use of SPC for functional mobility. No falls this year ADLs Comments:  Independent with ADLs. Cooks and does laundry. Housekeeping comes 1x for cleaning     Hand Dominance   Dominant Hand: Right    Extremity/Trunk Assessment   Upper Extremity Assessment Upper Extremity Assessment: Overall WFL for tasks assessed    Lower Extremity Assessment Lower Extremity Assessment: Overall WFL for tasks assessed       Communication   Communication: No difficulties  Cognition Arousal/Alertness: Awake/alert Behavior During Therapy: WFL for tasks assessed/performed Overall Cognitive Status: Within Functional Limits for tasks assessed                                 General Comments: Very pleasant, sweet lady        General Comments General comments (skin integrity, edema, etc.): BP 143/87 mmHg MAP 103 in RUE sitting EOB at beginning of session after working with OT.    Exercises     Assessment/Plan    PT Assessment Patient does not need any further PT services  PT Problem List         PT Treatment Interventions      PT Goals (Current goals can be found in the Care Plan section)  Acute Rehab PT Goals Patient Stated Goal: go home PT Goal Formulation: With patient Time For Goal Achievement: 02/14/21 Potential to Achieve Goals: Good    Frequency       Co-evaluation               AM-PAC PT "6 Clicks" Mobility  Outcome Measure Help needed turning from your back to your side while in a flat bed without using bedrails?: None Help needed moving from lying on your back to sitting on the side of a flat bed without using bedrails?: None Help needed moving to and from a bed to a chair (including a wheelchair)?: None Help needed standing up from a chair using your arms (e.g., wheelchair or bedside chair)?: None Help needed to walk in hospital room?: None Help needed climbing 3-5 steps with a railing? : A Little 6 Click Score: 23    End of Session   Activity Tolerance: Patient tolerated treatment well Patient left: in bed;with  nursing/sitter in room Nurse Communication: Mobility status (IV bleeding)      Time: 5409-8119 PT Time Calculation (min) (ACUTE ONLY): 11 min   Charges:   PT Evaluation $PT Eval Low Complexity: [place], PT, DPT 01/31/21, 10:40 AM   Waunita Schooner 01/31/2021, 10:38 AM

## 2021-01-31 NOTE — Progress Notes (Signed)
Echocardiogram  Signed Rocky Crafts

## 2021-01-31 NOTE — ED Notes (Signed)
Pt to MRI

## 2021-02-01 DIAGNOSIS — G9341 Metabolic encephalopathy: Secondary | ICD-10-CM

## 2021-02-01 DIAGNOSIS — N3001 Acute cystitis with hematuria: Secondary | ICD-10-CM | POA: Diagnosis not present

## 2021-02-01 DIAGNOSIS — I10 Essential (primary) hypertension: Secondary | ICD-10-CM | POA: Diagnosis not present

## 2021-02-01 DIAGNOSIS — I6521 Occlusion and stenosis of right carotid artery: Secondary | ICD-10-CM

## 2021-02-01 DIAGNOSIS — G459 Transient cerebral ischemic attack, unspecified: Secondary | ICD-10-CM

## 2021-02-01 LAB — HEMOGLOBIN A1C
Hgb A1c MFr Bld: 6.1 % — ABNORMAL HIGH (ref 4.8–5.6)
Mean Plasma Glucose: 128 mg/dL

## 2021-02-01 MED ORDER — CEPHALEXIN 500 MG PO CAPS: 500.0000 mg | ORAL_CAPSULE | Freq: Three times a day (TID) | ORAL | Status: DC

## 2021-02-01 MED ORDER — ASPIRIN EC 81 MG PO TBEC: 81.0000 mg | DELAYED_RELEASE_TABLET | Freq: Every day | ORAL | Status: DC

## 2021-02-01 MED ORDER — RAMIPRIL 10 MG PO CAPS: 10.0000 mg | ORAL_CAPSULE | Freq: Every evening | ORAL | 0 refills | Status: DC

## 2021-02-01 MED ORDER — CEPHALEXIN 500 MG PO CAPS
500.0000 mg | ORAL_CAPSULE | Freq: Three times a day (TID) | ORAL | 0 refills | Status: AC
Start: 2021-02-01 — End: 2021-02-04

## 2021-02-01 MED ORDER — AMLODIPINE BESYLATE 5 MG PO TABS: 5.0000 mg | ORAL_TABLET | Freq: Every day | ORAL | 0 refills | Status: DC

## 2021-02-01 MED ORDER — ASPIRIN 81 MG PO TBEC: 81.0000 mg | DELAYED_RELEASE_TABLET | Freq: Every day | ORAL | 0 refills | Status: AC

## 2021-02-01 NOTE — Care Management CC44 (Signed)
Condition Code 44 Documentation Completed  Patient Details  Name: Olivia [name] MRN: [id number] Date of Birth: [birth year]   Condition Code 44 given:  Yes Patient signature on Condition Code 44 notice:  Yes Documentation of 2 MD's agreement:  Yes Code 44 added to claim:  Yes    Candie Chroman, LCSW 02/01/2021, 9:53 AM

## 2021-02-01 NOTE — Discharge Summary (Signed)
[facility] [place] NAME: Olivia [name]    MR#:  [id number]  DATE OF BIRTH:  02/19/1931  DATE OF ADMISSION:  01/30/2021 ADMITTING PHYSICIAN: Loletha Grayer, MD  DATE OF DISCHARGE: 02/01/2021 10:30 AM  PRIMARY CARE PHYSICIAN: Albina Billet, MD    ADMISSION DIAGNOSIS:  TIA (transient ischemic attack) [G45.9] Dizziness [R42] Acute confusion [Y77.4] Acute metabolic encephalopathy [J28.78]  DISCHARGE DIAGNOSIS:  Principal Problem:   Episode of confusion Active Problems:   Accelerated hypertension   UTI (urinary tract infection)   Postural dizziness with presyncope   Hypertensive urgency   Acute metabolic encephalopathy   SECONDARY DIAGNOSIS:   Past Medical History:  Diagnosis Date   Arthritis    Breast cancer ([place]) 2001   left, invasive ductal carcinoma T1 N0 ER/PR positive   Colitis March 2016   Hypertension    Personal history of malignant neoplasm of breast    Personal history of radiation therapy     HOSPITAL COURSE:   1.  Acute metabolic encephalopathy.  Mental status improved back to baseline.  Unclear if this was a TIA versus hypertensive encephalopathy versus acute cystitis with hematuria causing this.  Either way mental status back to baseline.  Stable for discharge home. 2.  Accelerated hypertension.  The patient was recently stopped off her antihypertensive blood pressure medications as outpatient.  The patient was placed on Norvasc and back on her ramipril.  Blood pressure fluctuated throughout the day yesterday.  Blood pressure upon discharge 163/79.  Recommend following up as outpatient.  The patient was not orthostatic in the hospital. 3.  Possible TIA.  Started on aspirin.  Patient already on atorvastatin with LDL at goal 75. 4.  Acute cystitis with hematuria on Rocephin.  Klebsiella growing out of urine culture but sensitivities still pending.  The patient received 2 days of Rocephin here in the hospital  and I will prescribe Keflex for 3 more days upon going home.  Follow-up sensitivities. 5.  Right carotid stenosis.  CT angiogram showing 60% stenosis on the right side.  The patient already sees Dr. Delana Meyer vascular surgery as outpatient for another reason.  She can follow-up with him for the right carotid stenosis. 6.  Glaucoma unspecified continue your eyedrops 7.  History of breast cancer  DISCHARGE CONDITIONS:   Satisfactory  CONSULTS OBTAINED:  None  DRUG ALLERGIES:  No Known Allergies  DISCHARGE MEDICATIONS:   Allergies as of 02/01/2021   No Known Allergies      Medication List     STOP taking these medications    CALCIUM + D PO   mometasone 50 MCG/ACT nasal spray Commonly known as: NASONEX   Muro 128 2 % ophthalmic solution Generic drug: sodium chloride   tolterodine 4 MG 24 hr capsule Commonly known as: DETROL LA       TAKE these medications    acetaminophen 500 MG tablet Commonly known as: TYLENOL Take 500 mg by mouth every 6 (six) hours as needed for pain.   amLODipine 5 MG tablet Commonly known as: NORVASC Take 1 tablet (5 mg total) by mouth daily.   aspirin 81 MG EC tablet Take 1 tablet (81 mg total) by mouth daily. Swallow whole. Start taking on: February 02, 2021   atorvastatin 10 MG tablet Commonly known as: LIPITOR Take 10 mg by mouth daily.   calcium carbonate 1250 (500 Ca) MG tablet Commonly known as: OS-CAL - dosed in mg of elemental calcium Take 1 tablet  by mouth.   cephALEXin 500 MG capsule Commonly known as: KEFLEX Take 1 capsule (500 mg total) by mouth every 8 (eight) hours for 3 days.   Cranberry 400 MG Caps Take by mouth.   diclofenac sodium 1 % Gel Commonly known as: VOLTAREN Apply 2 g topically 4 (four) times daily.   dorzolamide-timolol 22.3-6.8 MG/ML ophthalmic solution Commonly known as: COSOPT SMARTSIG:In Eye(s)   furosemide 20 MG tablet Commonly known as: LASIX Take 20 mg by mouth as needed.   multivitamin  capsule Take 1 capsule by mouth daily.   prednisoLONE acetate 1 % ophthalmic suspension Commonly known as: PRED FORTE 1 drop 4 (four) times daily.   ramipril 10 MG capsule Commonly known as: ALTACE Take 1 capsule (10 mg total) by mouth every evening. What changed:  medication strength how much to take when to take this   Travoprost (BAK Free) 0.004 % Soln ophthalmic solution Commonly known as: TRAVATAN 1 drop at bedtime.         DISCHARGE INSTRUCTIONS:    Follow-up with PMD in 5 days Continue to keep follow-up appointment with Dr. Delana Meyer in a few months.  If you experience worsening of your admission symptoms, develop shortness of breath, life threatening emergency, suicidal or homicidal thoughts you must seek medical attention immediately by calling 911 or calling your MD immediately  if symptoms less severe.  You Must read complete instructions/literature along with all the possible adverse reactions/side effects for all the Medicines you take and that have been prescribed to you. Take any new Medicines after you have completely understood and accept all the possible adverse reactions/side effects.   Please note  You were cared for by a hospitalist during your hospital stay. If you have any questions about your discharge medications or the care you received while you were in the hospital after you are discharged, you can call the unit and asked to speak with the hospitalist on call if the hospitalist that took care of you is not available. Once you are discharged, your primary care physician will handle any further medical issues. Please note that NO REFILLS for any discharge medications will be authorized once you are discharged, as it is imperative that you return to your primary care physician (or establish a relationship with a primary care physician if you do not have one) for your aftercare needs so that they can reassess your need for medications and monitor your lab  values.    Today   CHIEF COMPLAINT:   Chief Complaint  Patient presents with   Hypertension    HISTORY OF PRESENT ILLNESS:  Olivia [name]  is a 88 y.o. female came in with altered mental status   VITAL SIGNS:  Blood pressure (!) 163/79, pulse 80, temperature 97.8 F (36.6 C), temperature source Oral, resp. rate 16, height 5\' 4"  (1.626 m), weight 79.4 kg, SpO2 98 %.  I/O:   Intake/Output Summary (Last 24 hours) at 02/01/2021 1624 Last data filed at 02/01/2021 0900 Gross per 24 hour  Intake 313.4 ml  Output 900 ml  Net -586.6 ml    PHYSICAL EXAMINATION:  GENERAL:  88 y.o.-year-old patient lying in the bed with no acute distress.  EYES: Pupils equal, round, reactive to light and accommodation. No scleral icterus. Extraocular muscles intact.  HEENT: Head atraumatic, normocephalic. Oropharynx and nasopharynx clear.  LUNGS: Normal breath sounds bilaterally, no wheezing, rales,rhonchi or crepitation. No use of accessory muscles of respiration.  CARDIOVASCULAR: S1, S2 normal. No murmurs, rubs, or  gallops.  ABDOMEN: Soft, non-tender, non-distended. Bowel sounds present. No organomegaly or mass.  EXTREMITIES: No pedal edema, cyanosis, or clubbing.  NEUROLOGIC: Cranial nerves II through XII are intact. Muscle strength 5/5 in all extremities. Sensation intact. Gait not checked.  PSYCHIATRIC: The patient is alert and oriented x 3.  SKIN: No obvious rash, lesion, or ulcer.   DATA REVIEW:   CBC Recent Labs  Lab 01/30/21 2109  WBC 9.3  HGB 12.5  HCT 37.9  PLT 280    Chemistries  Recent Labs  Lab 01/30/21 2109  NA 141  K 3.8  CL 107  CO2 25  GLUCOSE 110*  BUN 16  CREATININE 0.76  CALCIUM 9.0  AST 28  ALT 18  ALKPHOS 75  BILITOT 0.7     Microbiology Results  Results for orders placed or performed during the hospital encounter of 01/30/21  Urine Culture     Status: Abnormal (Preliminary result)   Collection Time: 01/30/21 10:00 PM   Specimen: Urine, Clean  Catch  Result Value Ref Range Status   Specimen Description   Final    URINE, CLEAN CATCH Performed at [facility], [address] [place], [place] [address]    Special Requests   Final    Normal Performed at [facility], [address] [place], [place] [address]    Culture (A)  Final    >=100,000 COLONIES/mL KLEBSIELLA SPECIES SUSCEPTIBILITIES TO FOLLOW Performed at [facility], [place] [address], [place],  [address]    Report Status PENDING  Incomplete  Resp Panel by RT-PCR (Flu A&B, Covid) Nasopharyngeal Swab     Status: None   Collection Time: 01/31/21 12:05 AM   Specimen: Nasopharyngeal Swab; Nasopharyngeal(NP) swabs in vial transport medium  Result Value Ref Range Status   SARS Coronavirus 2 by RT PCR NEGATIVE NEGATIVE Final    Comment: (NOTE) SARS-CoV-2 target nucleic acids are NOT DETECTED.  The SARS-CoV-2 RNA is generally detectable in upper respiratory specimens during the acute phase of infection. The lowest concentration of SARS-CoV-2 viral copies this assay can detect is 138 copies/mL. A negative result does not preclude SARS-Cov-2 infection and should not be used as the sole basis for treatment or other patient management decisions. A negative result may occur with  improper specimen collection/handling, submission of specimen other than nasopharyngeal swab, presence of viral mutation(s) within the areas targeted by this assay, and inadequate number of viral copies(<138 copies/mL). A negative result must be combined with clinical observations, patient history, and epidemiological information. The expected result is Negative.  Fact Sheet for Patients:  EntrepreneurPulse.com.au  Fact Sheet for Healthcare Providers:  IncredibleEmployment.be  This test is no t yet approved or cleared by the [place] FDA and  has been authorized for detection and/or diagnosis of SARS-CoV-2 by FDA under an  Emergency Use Authorization (EUA). This EUA will remain  in effect (meaning this test can be used) for the duration of the COVID-19 declaration under Section 564(b)(1) of the Act, 21 U.S.C.section 360bbb-3(b)(1), unless the authorization is terminated  or revoked sooner.       Influenza A by PCR NEGATIVE NEGATIVE Final   Influenza B by PCR NEGATIVE NEGATIVE Final    Comment: (NOTE) The Xpert Xpress SARS-CoV-2/FLU/RSV plus assay is intended as an aid in the diagnosis of influenza from Nasopharyngeal swab specimens and should not be used as a sole basis for treatment. Nasal washings and aspirates are unacceptable for Xpert Xpress SARS-CoV-2/FLU/RSV testing.  Fact Sheet  for Patients: EntrepreneurPulse.com.au  Fact Sheet for Healthcare Providers: IncredibleEmployment.be  This test is not yet approved or cleared by the [place] FDA and has been authorized for detection and/or diagnosis of SARS-CoV-2 by FDA under an Emergency Use Authorization (EUA). This EUA will remain in effect (meaning this test can be used) for the duration of the COVID-19 declaration under Section 564(b)(1) of the Act, 21 U.S.C. section 360bbb-3(b)(1), unless the authorization is terminated or revoked.  Performed at [facility], [address] [place], [place] [address]     RADIOLOGY:  CT ANGIO HEAD NECK W WO CM  Result Date: 01/31/2021 CLINICAL DATA:  TIA.  Hypertension EXAM: CT ANGIOGRAPHY HEAD AND NECK TECHNIQUE: Multidetector CT imaging of the head and neck was performed using the standard protocol during bolus administration of intravenous contrast. Multiplanar CT image reconstructions and MIPs were obtained to evaluate the vascular anatomy. Carotid stenosis measurements (when applicable) are obtained utilizing NASCET criteria, using the distal internal carotid diameter as the denominator. RADIATION DOSE REDUCTION: This exam was performed according to the  departmental dose-optimization program which includes automated exposure control, adjustment of the mA and/or kV according to patient size and/or use of iterative reconstruction technique. CONTRAST:  78mL OMNIPAQUE IOHEXOL 350 MG/ML SOLN COMPARISON:  CT head 01/30/2021.  MRI of 01/31/2021 FINDINGS: CT HEAD FINDINGS Brain: Generalized atrophy. Mild white matter hypodensity. This is unchanged from prior study. Negative for acute infarct, hemorrhage, or mass. Vascular: Negative for hyperdense vessel Skull: Negative Sinuses: Paranasal sinuses clear. Orbits: Bilateral ocular surgery. Review of the MIP images confirms the above findings CTA NECK FINDINGS Aortic arch: Extensive atherosclerotic disease in the aortic arch without aneurysm. Atherosclerotic disease proximal great vessels without significant stenosis Right carotid system: Atherosclerotic calcification right carotid bifurcation. 60% diameter stenosis proximal right internal carotid artery. Moderate stenosis proximal right external carotid artery. Left carotid system: Atherosclerotic calcification left carotid bifurcation. 45% diameter stenosis proximal left internal carotid artery. Left external carotid artery patent. Vertebral arteries: Both vertebral arteries patent to the skull base without stenosis. Skeleton: Multilevel cervical spondylosis. No acute skeletal abnormality. Other neck: Negative for mass or adenopathy. Upper chest: Mild apical emphysema and subpleural scarring. No acute abnormality. Review of the MIP images confirms the above findings CTA HEAD FINDINGS Anterior circulation: Atherosclerotic calcification of the cavernous carotid bilaterally with moderate stenosis bilaterally. Anterior in middle cerebral artery patent without stenosis or large vessel occlusion. Negative for vascular malformation Posterior circulation: Both vertebral arteries patent to the basilar. Basilar widely patent. Superior cerebellar and posterior cerebral arteries patent  bilaterally. Venous sinuses: Normal venous enhancement. Anatomic variants: None Review of the MIP images confirms the above findings IMPRESSION: 1. Atrophy and chronic microvascular ischemia. No acute intracranial abnormality. 2. 60% diameter stenosis proximal right internal carotid artery and 45% diameter stenosis proximal left internal carotid artery. Moderate stenosis in the cavernous carotid bilaterally 3. Both vertebral arteries widely patent 4. Negative for   large vessel occlusion. Electronically Signed   By: Franchot Gallo M.D.   On: 01/31/2021 15:20   DG Chest 1 View  Result Date: 01/30/2021 CLINICAL DATA:  Hypertension, dizziness, altered mental status EXAM: CHEST  1 VIEW COMPARISON:  None. FINDINGS: Chronic interstitial thickening noted. No superimposed confluent pulmonary infiltrate. No pneumothorax or pleural effusion. Cardiac size within normal limits. Pulmonary vascularity is normal. Surgical clips seen within the left axilla. No acute bone abnormality. IMPRESSION: No active disease. Electronically Signed   By: Fidela Salisbury M.D.   On: 01/30/2021 21:29  CT HEAD WO CONTRAST (5MM)  Result Date: 01/30/2021 CLINICAL DATA:  Dizziness EXAM: CT HEAD WITHOUT CONTRAST TECHNIQUE: Contiguous axial images were obtained from the base of the skull through the vertex without intravenous contrast. RADIATION DOSE REDUCTION: This exam was performed according to the departmental dose-optimization program which includes automated exposure control, adjustment of the mA and/or kV according to patient size and/or use of iterative reconstruction technique. COMPARISON:  None. FINDINGS: Brain: No acute territorial infarction, hemorrhage or intracranial mass. Moderate atrophy. Mild chronic small vessel ischemic changes of the white matter. The ventricles are nonenlarged Vascular: No hyperdense vessels.  Carotid vascular calcification Skull: Normal. Negative for fracture or focal lesion. Sinuses/Orbits: No acute  finding. Other: None IMPRESSION: 1. No CT evidence for acute intracranial abnormality. 2. Atrophy and chronic small vessel ischemic changes of the white matter. Electronically Signed   By: Donavan Foil M.D.   On: 01/30/2021 21:56   MR BRAIN WO CONTRAST  Result Date: 01/31/2021 CLINICAL DATA:  89 year old female with dizziness, altered mental status. EXAM: MRI HEAD WITHOUT CONTRAST TECHNIQUE: Multiplanar, multiecho pulse sequences of the brain and surrounding structures were obtained without intravenous contrast. COMPARISON:  Head CT 01/30/2021. FINDINGS: Brain: No restricted diffusion to suggest acute infarction. No midline shift, mass effect, evidence of mass lesion, ventriculomegaly, extra-axial collection or acute intracranial hemorrhage. Cervicomedullary junction and pituitary are within normal limits. Cerebral volume is within normal limits for age. Several tiny chronic lacunar infarcts in the bilateral cerebellum (series 10, image 6). But otherwise normal for age gray and white matter signal throughout the brain. No cortical encephalomalacia or chronic cerebral blood products identified. Vascular: Major intracranial vascular flow voids are preserved. Skull and upper cervical spine: Negative. Visualized bone marrow signal is within normal limits. Sinuses/Orbits: Postoperative changes to both globes. Orbits are within normal limits. Trace paranasal sinus mucosal thickening. Other: Mastoids are well aerated. Grossly normal visible internal auditory structures. Negative visible scalp and face. IMPRESSION: No acute intracranial abnormality. Largely normal for age noncontrast MRI appearance of the brain; there are a few tiny chronic infarcts in the cerebellum. Electronically Signed   By: Genevie Ann M.D.   On: 01/31/2021 04:21   [place] Carotid Bilateral (at [facility] and [facility] only)  Result Date: 01/31/2021 CLINICAL DATA:  TIA, hypertension, syncope and diabetes. EXAM: BILATERAL CAROTID DUPLEX ULTRASOUND TECHNIQUE: Pearline Cables  scale imaging, color Doppler and duplex ultrasound were performed of bilateral carotid and vertebral arteries in the neck. COMPARISON:  Prior study on 08/04/2016 FINDINGS: Criteria: Quantification of carotid stenosis is based on velocity parameters that correlate the residual internal carotid diameter with NASCET-based stenosis levels, using the diameter of the distal internal carotid lumen as the denominator for stenosis measurement. The following velocity measurements were obtained: RIGHT ICA:  139/25 cm/sec CCA:  35/36 cm/sec SYSTOLIC ICA/CCA RATIO:  2.3 ECA:  200 cm/sec LEFT ICA:  93/21 cm/sec CCA:  14/43 cm/sec SYSTOLIC ICA/CCA RATIO:  1.1 ECA:  205 cm/sec RIGHT CAROTID ARTERY: Moderate calcified plaque at the level of the carotid bulb extending into the proximal right ICA. Estimated right ICA stenosis is 50-69%. RIGHT VERTEBRAL ARTERY: Antegrade flow with normal waveform and velocity. LEFT CAROTID ARTERY: Moderate calcified plaque at the level of the carotid bulb and proximal left ICA. Estimated left ICA stenosis is less than 50%. LEFT VERTEBRAL ARTERY: Antegrade flow with normal waveform and velocity. IMPRESSION: Moderate plaque at both carotid bifurcations. Estimated right ICA stenosis is 50-69% and left ICA stenosis is less than 50% based on velocity criteria.  Electronically Signed   By: Aletta Edouard M.D.   On: 01/31/2021 11:57   ECHOCARDIOGRAM COMPLETE  Result Date: 01/31/2021    ECHOCARDIOGRAM REPORT   Patient Name:   Olivia [name] Date of Exam: 01/31/2021 Medical Rec #:  [id number]          Height:       64.0 in Accession #:    [id number]         Weight:       175.0 lb Date of Birth:  1931/04/11          BSA:          1.848 m Patient Age:    65 years           BP:           185/57 mmHg Patient Gender: F                  HR:           96 bpm. Exam Location:  [facility] Procedure: 2D Echo, Cardiac Doppler and Color Doppler Indications:     TIA G45.9  History:         Patient has no prior history of  Echocardiogram examinations.                  Risk Factors:Hypertension.  Sonographer:     Alyse Low Roar Referring Phys:  [id number] Athena Masse Diagnosing Phys: Serafina Royals MD IMPRESSIONS  1. Left ventricular ejection fraction, by estimation, is 60 to 65%. The left ventricle has normal function. The left ventricle has no regional wall motion abnormalities. Left ventricular diastolic parameters were normal.  2. Right ventricular systolic function is normal. The right ventricular size is normal.  3. Left atrial size was mildly dilated.  4. The mitral valve is normal in structure. Mild to moderate mitral valve regurgitation.  5. The aortic valve is normal in structure. Aortic valve regurgitation is trivial. FINDINGS  Left Ventricle: Left ventricular ejection fraction, by estimation, is 60 to 65%. The left ventricle has normal function. The left ventricle has no regional wall motion abnormalities. The left ventricular internal cavity size was normal in size. There is  no left ventricular hypertrophy. Left ventricular diastolic parameters were normal. Right Ventricle: The right ventricular size is normal. No increase in right ventricular wall thickness. Right ventricular systolic function is normal. Left Atrium: Left atrial size was mildly dilated. Right Atrium: Right atrial size was normal in size. Pericardium: There is no evidence of pericardial effusion. Mitral Valve: The mitral valve is normal in structure. Mild to moderate mitral valve regurgitation. Tricuspid Valve: The tricuspid valve is normal in structure. Tricuspid valve regurgitation is mild. Aortic Valve: The aortic valve is normal in structure. Aortic valve regurgitation is trivial. Aortic valve peak gradient measures 12.1 mmHg. Pulmonic Valve: The pulmonic valve was normal in structure. Pulmonic valve regurgitation is not visualized. Aorta: The aortic root and ascending aorta are structurally normal, with no evidence of dilitation. IAS/Shunts: No atrial  level shunt detected by color flow Doppler.  LEFT VENTRICLE PLAX 2D LVIDd:         4.30 cm   Diastology LVIDs:         3.10 cm   LV e' medial:    5.77 cm/s LV PW:         1.10 cm   LV E/e' medial:  14.5 LV IVS:        1.10 cm   LV e'  lateral:   8.59 cm/s LVOT diam:     1.70 cm   LV E/e' lateral: 9.8 LVOT Area:     2.27 cm  RIGHT VENTRICLE RV Mid diam:    2.90 cm RV S prime:     11.50 cm/s TAPSE (M-mode): 2.0 cm LEFT ATRIUM             Index        RIGHT ATRIUM           Index LA diam:        3.70 cm 2.00 cm/m   RA Area:     11.50 cm LA Vol (A2C):   39.2 ml 21.21 ml/m  RA Volume:   25.80 ml  13.96 ml/m LA Vol (A4C):   47.7 ml 25.81 ml/m LA Biplane Vol: 45.0 ml 24.34 ml/m  AORTIC VALVE                 PULMONIC VALVE AV Area (Vmax): 1.51 cm     PV Vmax:        0.96 m/s AV Vmax:        174.00 cm/s  PV Peak grad:   3.7 mmHg AV Peak Grad:   12.1 mmHg    RVOT Peak grad: 2 mmHg LVOT Vmax:      116.00 cm/s  AORTA Ao Root diam: 3.00 cm Ao Asc diam:  3.10 cm MITRAL VALVE                TRICUSPID VALVE MV Area (PHT): 4.08 cm     TR Peak grad:   22.7 mmHg MV Decel Time: 186 msec     TR Vmax:        238.00 cm/s MV E velocity: 83.90 cm/s MV A velocity: 103.00 cm/s  SHUNTS MV E/A ratio:  0.81         Systemic Diam: 1.70 cm MV A Prime:    7.4 cm/s Serafina Royals MD Electronically signed by Serafina Royals MD Signature Date/Time: 01/31/2021/1:52:42 PM    Final       Management plans discussed with the patient, and she is in agreement.  CODE STATUS:     Code Status Orders  (From admission, onward)           Start     Ordered   01/31/21 0010  Full code  Continuous        01/31/21 0011           Code Status History     This patient has a current code status but no historical code status.      Advance Directive Documentation    Flowsheet Row Most Recent Value  Type of Advance Directive Healthcare Power of Attorney, Living will  Pre-existing out of facility DNR order (yellow form or pink MOST  form) --  "MOST" Form in Place? --       TOTAL TIME TAKING CARE OF THIS PATIENT: 35 minutes.    Loletha Grayer M.D on 02/01/2021 at 4:24 PM    [facility]  CC: Primary care physician; Albina Billet, MD

## 2021-02-01 NOTE — TOC CM/SW Note (Signed)
Patient has orders to discharge home today. PCP is Benita Stabile, MD. On room air. No wounds. Her husband will take her home today. No TOC needs identified. CSW signing off.  Dayton Scrape, [place]

## 2021-02-01 NOTE — Care Management Obs Status (Signed)
[facility] NOTIFICATION   Patient Details  Name: MCKENSEY BERGHUIS MRN: [id number] Date of Birth: 1931/05/14   Medicare Observation Status Notification Given:  Yes    Candie Chroman, LCSW 02/01/2021, 9:53 AM

## 2021-02-01 NOTE — Progress Notes (Signed)
Discussed d/c instructions with pt, PIVs removed, no distress noted.

## 2021-02-02 LAB — URINE CULTURE
Culture: >=100000 — AB
Special Requests: NORMAL

## 2021-09-15 ENCOUNTER — Encounter: Payer: Self-pay | Admitting: Urology

## 2021-09-15 ENCOUNTER — Ambulatory Visit: Payer: Medicare PPO | Admitting: Urology

## 2021-09-15 VITALS — BP 133/76 | HR 101 | Ht 64.0 in | Wt 165.0 lb

## 2021-09-15 DIAGNOSIS — K5909 Other constipation: Secondary | ICD-10-CM

## 2021-09-15 DIAGNOSIS — R339 Retention of urine, unspecified: Secondary | ICD-10-CM | POA: Diagnosis not present

## 2021-09-15 DIAGNOSIS — N8111 Cystocele, midline: Secondary | ICD-10-CM | POA: Diagnosis not present

## 2021-09-15 DIAGNOSIS — N3000 Acute cystitis without hematuria: Secondary | ICD-10-CM

## 2021-09-15 DIAGNOSIS — N3941 Urge incontinence: Secondary | ICD-10-CM | POA: Diagnosis not present

## 2021-09-15 DIAGNOSIS — Z87440 Personal history of urinary (tract) infections: Secondary | ICD-10-CM

## 2021-09-15 LAB — URINALYSIS, COMPLETE
Bilirubin, UA: NEGATIVE
Glucose, UA: NEGATIVE
Ketones, UA: NEGATIVE
Nitrite, UA: POSITIVE — AB
Protein,UA: NEGATIVE
Specific Gravity, UA: 1.015 (ref 1.005–1.030)
Urobilinogen, Ur: 0.2 mg/dL (ref 0.2–1.0)
pH, UA: 6 (ref 5.0–7.5)

## 2021-09-15 LAB — BLADDER SCAN AMB NON-IMAGING

## 2021-09-15 LAB — MICROSCOPIC EXAMINATION: WBC, UA: >30 /hpf — AB (ref 0–5)

## 2021-09-15 MED ORDER — CIPROFLOXACIN HCL 500 MG PO TABS: 500.0000 mg | ORAL_TABLET | Freq: Two times a day (BID) | ORAL | 0 refills | Status: DC

## 2021-09-15 MED ORDER — GEMTESA 75 MG PO TABS: 75.0000 mg | ORAL_TABLET | Freq: Every day | ORAL | 0 refills | Status: DC

## 2021-09-15 NOTE — Progress Notes (Signed)
09/15/2021 2:54 PM   Laurence Compton 05/26/1931 [id number]  Referring provider: Albina Billet, MD [address]   [place],  [place] [address]  Chief Complaint  Patient presents with   Recurrent UTI    HPI: 88 year old female referred for further evaluation of recurrent urinary tract infections.  She was seen and evaluated by her primary care physician 09/03/2021.  At that time, she was complaining of traditional UTI type symptoms.  Her urinalysis was frankly positive.  She grew pansensitive Pseudomonas.   Unfortunately, no additional information or urine culture data was provided for this consultation.  She was admitted in January 2023 for a TIA also found to have incidental UTI at that time.  At that time, urine culture grew Klebsiella.  Most recent abdominal/pelvic cross-sectional imaging in the form of CT abdomen pelvis in 2016 showed left-sided colitis, otherwise no GU pathology.  She mentions today that she has a large cystocele.  This was diagnosed back 10+ years ago.  That time she was seeing urogynecology at [facility].  She underwent what sounds like urodynamics and a bunch of testing and was told that she probably needed to have this repaired.  She tried and failed pessaries in the past.  At the time, she was any social situation having to care for her grandson and elected not to pursue surgery.  She continues to have a large protuberant cystocele.  On record review, this was previously graded as stage III anterior and stage I uterine prolapse.  Urodynamics indicates that she had normal capacity and sensation severe stress incontinence in the ISD range pressures, detrusor overactivity without leak and dysfunctional elevated PVR with a nonrelaxing urethral sphincter on EMG.  Today, her symptoms primarily are pelvic pressure and pain, more so on the left than the right.  She denies any overt dysuria.  She also has severe urgency, urge incontinence and urinary leakage.  She has  been on Detrol for quite some time.  Urodynamics indicated  She does have a personal history of breast cancer.  She also notes personal history of chronic constipation.  She uses Colace capsule as well as MiraLAX daily.  This sometimes is not effective.   PMH: Past Medical History:  Diagnosis Date   Arthritis    Breast cancer ([place]) 2001   left, invasive ductal carcinoma T1 N0 ER/PR positive   Colitis March 2016   Hypertension    Personal history of malignant neoplasm of breast    Personal history of radiation therapy     Surgical History: Past Surgical History:  Procedure Laterality Date   BREAST BIOPSY Left 2001   +   BREAST BIOPSY Left 2010   stereo scar tissure 2010   BREAST EXCISIONAL BIOPSY Left 2001   + Radation   BREAST LUMPECTOMY Left 2001   COLONOSCOPY  2012   Dr. Vira Agar   EYE SURGERY  2006, 2010   cataract   TONSILLECTOMY  as child    Home Medications:  Allergies as of 09/15/2021   No Known Allergies      Medication List        Accurate as of September 15, 2021  2:54 PM. If you have any questions, ask your nurse or doctor.          acetaminophen 500 MG tablet Commonly known as: TYLENOL Take 500 mg by mouth every 6 (six) hours as needed for pain.   amLODipine 5 MG tablet Commonly known as: NORVASC Take 1 tablet (5  mg total) by mouth daily.   aspirin EC 81 MG tablet Take 1 tablet (81 mg total) by mouth daily. Swallow whole.   atorvastatin 10 MG tablet Commonly known as: LIPITOR Take 10 mg by mouth daily.   calcium carbonate 1250 (500 Ca) MG tablet Commonly known as: OS-CAL - dosed in mg of elemental calcium Take 1 tablet by mouth.   ciprofloxacin 500 MG tablet Commonly known as: CIPRO Take 1 tablet (500 mg total) by mouth 2 (two) times daily. Started by: Hollice Espy, MD   Cranberry 400 MG Caps Take by mouth.   diclofenac sodium 1 % Gel Commonly known as: VOLTAREN Apply 2 g topically 4 (four) times daily.   dorzolamide-timolol  22.3-6.8 MG/ML ophthalmic solution Commonly known as: COSOPT SMARTSIG:In Eye(s)   furosemide 20 MG tablet Commonly known as: LASIX Take 20 mg by mouth as needed.   Gemtesa 75 MG Tabs Generic drug: Vibegron Take 75 mg by mouth daily. Started by: Hollice Espy, MD   multivitamin capsule Take 1 capsule by mouth daily.   prednisoLONE acetate 1 % ophthalmic suspension Commonly known as: PRED FORTE 1 drop 4 (four) times daily.   ramipril 10 MG capsule Commonly known as: ALTACE Take 1 capsule (10 mg total) by mouth every evening.   Travoprost (BAK Free) 0.004 % Soln ophthalmic solution Commonly known as: TRAVATAN 1 drop at bedtime.        Allergies: No Known Allergies  Family History: Family History  Problem Relation Age of Onset   Diabetes Father    Cancer Mother    Breast cancer Mother 70   Breast cancer Sister 44   Breast cancer Paternal Aunt 66    Social History:  reports that she has never smoked. She has never used smokeless tobacco. She reports that she does not drink alcohol and does not use drugs.   Physical Exam: BP 133/76   Pulse (!) 101   Ht '5\' 4"'$  (1.626 m)   Wt 165 lb (74.8 kg)   BMI 28.32 kg/m   Constitutional:  Alert and oriented, No acute distress. HEENT: [place] AT, moist mucus membranes.  Trachea midline, no masses. Skin: No rashes, bruises or suspicious lesions. Neurologic: Grossly intact, no focal deficits, moving all 4 extremities. Psychiatric: Normal mood and affect.  Laboratory Data: Lab Results  Component Value Date   WBC 9.3 01/30/2021   HGB 12.5 01/30/2021   HCT 37.9 01/30/2021   MCV 90.2 01/30/2021   PLT 280 01/30/2021    Lab Results  Component Value Date   CREATININE 0.76 01/30/2021    Lab Results  Component Value Date   HGBA1C 6.1 (H) 01/30/2021    Urinalysis Results for orders placed or performed in visit on 09/15/21  Microscopic Examination   Urine  Result Value Ref Range   WBC, UA >30 (A) 0 - 5 /hpf   RBC,  Urine 11-30 (A) 0 - 2 /hpf   Epithelial Cells (non renal) 0-10 0 - 10 /hpf   Bacteria, UA Many (A) None seen/Few  Urinalysis, Complete  Result Value Ref Range   Specific Gravity, UA 1.015 1.005 - 1.030   pH, UA 6.0 5.0 - 7.5   Color, UA Yellow Yellow   Appearance Ur Cloudy (A) Clear   Leukocytes,UA 3+ (A) Negative   Protein,UA Negative Negative/Trace   Glucose, UA Negative Negative   Ketones, UA Negative Negative   RBC, UA 1+ (A) Negative   Bilirubin, UA Negative Negative   Urobilinogen, Ur 0.2 0.2 -  1.0 mg/dL   Nitrite, UA Positive (A) Negative   Microscopic Examination See below:   Bladder Scan (Post Void Residual) in office  Result Value Ref Range   Scan Result 320m      Assessment & Plan:    1. History of recurrent UTIs 2 documented infections with nonspecific symptoms, suspect there may be also component of chronic bacterial colonization  Based on the appearance of her urine today, we will go ahead and treat her again this time with Cipro based on her previous Pseudomonas.  Not enough urine for culture today.  Not a great candidate for topical estrogen given her personal history of breast cancer.  We did discuss supplementation today with cranberry tablets, probiotics, and d-mannose.  2. Acute cystitis without hematuria As above  3. Urge incontinence Based on her age, anticholinergics are ill-advised and she is not emptying her bladder completely.  We will try her on Gemtesa for her urge incontinence however we will have to be very careful about her incomplete bladder emptying as this may be exacerbating her symptoms.  She may end up needing to CIC. - Urinalysis, Complete - Bladder Scan (Post Void Residual) in office  4. Incomplete bladder emptying Chronic, likely worsening possibly secondary to prolapse  May consider CIC teaching as is likely contributing factor to her recurrent infections  5. Cystocele, midline Chronic, failed pessary  At her age, she is  likely not a candidate for definitive repair, have to manage conservatively  6. Chronic constipation Contributing factor to her infections and bladder symptoms  Advised to increase MiraLAX to twice daily as well as Colace to twice daily with a goal of 1 formed bowel movement daily   Return for 1 month PVR/UA.  AHollice Espy MD  [facility] [address] [place] [phone] I spent 65 total minutes on the day of the encounter including pre-visit review of the medical record, face-to-face time with the patient, and post visit ordering of labs/imaging/tests.  [facility] as well as from [facility]CP and previous hospital admissions.  Previous imaging was also reviewed.

## 2021-09-15 NOTE — Patient Instructions (Addendum)
Recommend the following:  1.  Start taking cranberry tablets twice daily  2.  Add a daily probiotic or yogurt with live active culture to your daily diet  3.  Consider supplement d-mannose  4. Increase miralax and colace  5. Stop detrol, start Gemtesa  6. Start cipro

## 2021-10-13 ENCOUNTER — Encounter: Payer: Self-pay | Admitting: Urology

## 2021-10-13 ENCOUNTER — Telehealth: Payer: Self-pay | Admitting: Urology

## 2021-10-13 ENCOUNTER — Ambulatory Visit: Payer: Medicare PPO | Admitting: Urology

## 2021-10-13 VITALS — BP 122/75 | HR 90 | Ht 64.0 in | Wt 161.0 lb

## 2021-10-13 DIAGNOSIS — R339 Retention of urine, unspecified: Secondary | ICD-10-CM | POA: Diagnosis not present

## 2021-10-13 DIAGNOSIS — Z87440 Personal history of urinary (tract) infections: Secondary | ICD-10-CM

## 2021-10-13 DIAGNOSIS — N8111 Cystocele, midline: Secondary | ICD-10-CM | POA: Diagnosis not present

## 2021-10-13 DIAGNOSIS — N3946 Mixed incontinence: Secondary | ICD-10-CM

## 2021-10-13 DIAGNOSIS — N3941 Urge incontinence: Secondary | ICD-10-CM

## 2021-10-13 LAB — BLADDER SCAN AMB NON-IMAGING: Scan Result: 575

## 2021-10-13 NOTE — Progress Notes (Signed)
10/13/2021 3:00 PM   Olivia [name] 06/18/1931 [id number]  Referring provider: Albina Billet, MD [address]   [place],  [place] [address]  Chief Complaint  Patient presents with   Follow-up   Urinary Frequency    HPI: 88 year old female with complex GU history as outlined below including recurrent UTIs, pelvic organ prolapse, incomplete bladder emptying, mixed incontinence who returns today for routine 6-week follow-up.  Since last visit, she has not had any further UTIs which is reassuring.  We did try Gemtesa over the past 6 weeks as samples.    PVR 575 cc   PMH: Past Medical History:  Diagnosis Date   Arthritis    Breast cancer ([place]) 2001   left, invasive ductal carcinoma T1 N0 ER/PR positive   Colitis March 2016   Hypertension    Personal history of malignant neoplasm of breast    Personal history of radiation therapy     Surgical History: Past Surgical History:  Procedure Laterality Date   BREAST BIOPSY Left 2001   +   BREAST BIOPSY Left 2010   stereo scar tissure 2010   BREAST EXCISIONAL BIOPSY Left 2001   + Radation   BREAST LUMPECTOMY Left 2001   COLONOSCOPY  2012   Dr. Vira Agar   EYE SURGERY  2006, 2010   cataract   TONSILLECTOMY  as child    Home Medications:  Allergies as of 10/13/2021   No Known Allergies      Medication List        Accurate as of October 13, 2021  3:00 PM. If you have any questions, ask your nurse or doctor.          STOP taking these medications    Gemtesa 75 MG Tabs Generic drug: Vibegron Stopped by: Hollice Espy, MD       TAKE these medications    acetaminophen 500 MG tablet Commonly known as: TYLENOL Take 500 mg by mouth every 6 (six) hours as needed for pain.   amLODipine 5 MG tablet Commonly known as: NORVASC Take 1 tablet (5 mg total) by mouth daily.   aspirin EC 81 MG tablet Take 1 tablet (81 mg total) by mouth daily. Swallow whole.   atorvastatin 10 MG tablet Commonly known  as: LIPITOR Take 10 mg by mouth daily.   calcium carbonate 1250 (500 Ca) MG tablet Commonly known as: OS-CAL - dosed in mg of elemental calcium Take 1 tablet by mouth.   ciprofloxacin 500 MG tablet Commonly known as: CIPRO Take 1 tablet (500 mg total) by mouth 2 (two) times daily.   Cranberry 400 MG Caps Take by mouth.   diclofenac sodium 1 % Gel Commonly known as: VOLTAREN Apply 2 g topically 4 (four) times daily.   dorzolamide-timolol 22.3-6.8 MG/ML ophthalmic solution Commonly known as: COSOPT SMARTSIG:In Eye(s)   furosemide 20 MG tablet Commonly known as: LASIX Take 20 mg by mouth as needed.   multivitamin capsule Take 1 capsule by mouth daily.   prednisoLONE acetate 1 % ophthalmic suspension Commonly known as: PRED FORTE 1 drop 4 (four) times daily.   ramipril 10 MG capsule Commonly known as: ALTACE Take 1 capsule (10 mg total) by mouth every evening.   Travoprost (BAK Free) 0.004 % Soln ophthalmic solution Commonly known as: TRAVATAN 1 drop at bedtime.        Allergies: No Known Allergies  Family History: Family History  Problem Relation Age of Onset   Diabetes Father  Cancer Mother    Breast cancer Mother 3   Breast cancer Sister 49   Breast cancer Paternal Aunt 40    Social History:  reports that she has never smoked. She has never used smokeless tobacco. She reports that she does not drink alcohol and does not use drugs.   Physical Exam: BP 122/75   Pulse 90   Ht '5\' 4"'$  (1.626 m)   Wt 161 lb (73 kg)   BMI 27.64 kg/m   Constitutional:  Alert and oriented, No acute distress. HEENT: [place] AT, moist mucus membranes.  Trachea midline, no masses. Cardiovascular: No clubbing, cyanosis, or edema. Respiratory: Normal respiratory effort, no increased work of breathing. GI: Abdomen is soft, nontender, nondistended, no abdominal masses GU: No CVA tenderness Skin: No rashes, bruises or suspicious lesions. Neurologic: Grossly intact, no focal  deficits, moving all 4 extremities. Psychiatric: Normal mood and affect.  Laboratory Data: Lab Results  Component Value Date   WBC 9.3 01/30/2021   HGB 12.5 01/30/2021   HCT 37.9 01/30/2021   MCV 90.2 01/30/2021   PLT 280 01/30/2021    Lab Results  Component Value Date   CREATININE 0.76 01/30/2021    No results found for: "PSA"  No results found for: "TESTOSTERONE"  Lab Results  Component Value Date   HGBA1C 6.1 (H) 01/30/2021    Urinalysis    Component Value Date/Time   COLORURINE YELLOW (A) 01/30/2021 2200   APPEARANCEUR Cloudy (A) 09/15/2021 1413   LABSPEC 1.008 01/30/2021 2200   LABSPEC 1.010 09/04/2011 2221   PHURINE 7.0 01/30/2021 2200   GLUCOSEU Negative 09/15/2021 1413   GLUCOSEU Negative 09/04/2011 2221   HGBUR SMALL (A) 01/30/2021 2200   BILIRUBINUR Negative 09/15/2021 1413   BILIRUBINUR Negative 09/04/2011 2221   KETONESUR NEGATIVE 01/30/2021 2200   PROTEINUR Negative 09/15/2021 1413   PROTEINUR NEGATIVE 01/30/2021 2200   NITRITE Positive (A) 09/15/2021 1413   NITRITE POSITIVE (A) 01/30/2021 2200   LEUKOCYTESUR 3+ (A) 09/15/2021 1413   LEUKOCYTESUR LARGE (A) 01/30/2021 2200   LEUKOCYTESUR 3+ 09/04/2011 2221    Lab Results  Component Value Date   LABMICR See below: 09/15/2021   WBCUA >30 (A) 09/15/2021   LABEPIT 0-10 09/15/2021   BACTERIA Many (A) 09/15/2021    Pertinent Imaging: *** No results found for this or any previous visit.  No results found for this or any previous visit.  No results found for this or any previous visit.  No results found for this or any previous visit.  No results found for this or any previous visit.  No valid procedures specified. No results found for this or any previous visit.  No results found for this or any previous visit.   Assessment & Plan:    1. History of recurrent UTIs ***  2. Incomplete bladder emptying ***   Return if symptoms worsen or fail to improve.  Hollice Espy,  MD  [facility] [address] [place], [place] [address] [phone]

## 2021-10-13 NOTE — Patient Instructions (Signed)
What is robotic-assisted sacrocolpopexy? Robotic-assisted sacrocolpopexy is a type of surgery. It is done to repair pelvic organ prolapse. The surgery is done with special tools.  Your pelvis is a bowl-shaped cavity made of a set of bones in the lower part of your belly (abdomen). Within this area there are several organs. These include the uterus, the bladder, and the lower part of your intestines. Strong tissues help hold these organs in place. If the tissues weaken, one or more of these organs may drop down and press against or bulge into the vagina. This is called pelvic organ prolapse. One type of pelvic organ prolapse is called vaginal vault prolapse. This is when the upper part of the vagina folds down into the lower part. Or it may even push outside the vaginal opening. This can happen after a hysterectomy.  Robotic-assisted sacrocolpopexy is one type of surgery to repair this problem. It is done to pull up the tissues and move the organs back into place. It is a minimally invasive method. This means it uses smaller cuts (incisions) than a standard surgery. It's done while you're asleep under general anesthesia.  During the surgery, your doctor will put small tools and a tiny camera through small incisions on your lower belly. This gives your doctor a better view of the area in your body. Your doctor moves the tools using a robotic controller. This lets your doctor do very small and precise movements with the tools. A graft of tissue or synthetic mesh is sewn onto the pelvic organs that have prolapsed. The graft is then attached to a bony area at the lower part of the spinal column. This helps keep the pelvic organs in place. The tools are then removed. The incisions are closed and bandaged.  Robotic-assisted sacrocolpopexy has some benefits over other methods. It may have a lower risk of complications for some people. It can lead to a shorter hospital stay and a faster recovery time.  This  surgery may not be available in all areas. Robotic surgery is often more expensive than other methods. It can take longer than other types of surgery. Your healthcare provider can help you decide which surgery will work best for you.  Why might I need robotic-assisted sacrocolpopexy? Robotic-assisted sacrocolpopexy can help relieve the symptoms of pelvic organ prolapse, such as:  Fullness or pressure in the vagina A bulge in the vagina or tissue bulging out from the vagina Leaking urine when you cough, sneeze, or laugh Sudden urges to urinate Constipation Pain with sexual intercourse Pelvic organ prolapse can sometimes be treated without surgery. These treatments can include pelvic floor exercises or the use of a pessary. A pessary is a small device inserted into the vagina to provide support. Your healthcare provider may advise surgery if these options don't work, or if you have moderate to severe prolapse. Your provider may advise you to have the surgery only if you don't plan to have children in the future.  If you decide to have surgery, you may have certain options. For example, some women choose to have their uterus removed (hysterectomy) as part of their surgery. The best type of surgery for you may vary depending on the severity and location of your prolapse. Your provider can help you decide which type of surgery may be best for you.  What are the risks for robotic-assisted sacrocolpopexy? Every surgery has risks. The risks of this procedure include:  Infection Excess bleeding Blood clots that can travel to the lungs  and cause breathing problems Injury to nearby organs such as the bowel or ureters Wound healing problems Pain during sexual intercourse Inflammation in the vagina if mesh is used Reaction to anesthesia Failure of the organs to stay in place Return of prolapse symptoms Movement of the mesh Need for more surgery Your risks may vary depending on your age, your overall  health, and the severity and type of your prolapse. Before the surgery, talk with your healthcare provider about all of your concerns.  How do I prepare for a robotic-assisted sacrocolpopexy? Talk with your healthcare provider how to prepare for your surgery.  Tell your provider about all the medicines you take. This includes over-the-counter medicines such as aspirin and all prescription medicines. It also includes herbs, vitamins, and other supplements. You may need to stop taking some medicines before the surgery, such as blood thinners. If you smoke, you'll need to stop before your surgery. Smoking can delay healing. Talk with your provider if you need help to stop smoking. Don't eat or drink after midnight the night before your surgery. Tell your provider about any recent changes in your health, such as a fever. Follow any other instructions from your provider. You may need to have tests before your surgery, such as:  Electrocardiogram, to evaluate your heart rhythm Chest X-ray, to assess your heart and lungs Urine sample, to test for infection and other factors Blood tests, to check for infection, anemia, and kidney function What happens during a robotic-assisted sacrocolpopexy? Your healthcare provider can help explain the details of your surgery. An obstetrician/gynecologist (ob-gyn) surgeon or a urology surgeon will perform your surgery. He or she will be helped by a team of specialized nurses. In general, you can expect the following:  You will be given general anesthesia. This prevents pain and causes you to sleep through the surgery. A healthcare provider will carefully watch your vital signs, like your heart rate and blood pressure, during the surgery. You may be given antibiotics before and after the surgery. This is to help prevent infection. The surgeon will make a few small incisions on your lower abdomen. The surgeon will pass tools through the small incisions. These include  a tiny camera with a light, and several robotic tools. Your surgeon will use the robotic controller to move the tools to complete the different parts of the surgery. If you are going to have a hysterectomy, the surgeon will remove your uterus first.  In some cases your provider and you decide to leave a part of your cervix in place.  This may decrease the complications for the procedure. The surgeon will then lift up the prolapsed part of your vagina. A graft of tissue or synthetic mesh is sewn onto the pelvic organs that have prolapsed. This helps keep them in place. The graft or mesh is anchored with stitches to strong tissue in the pelvic area, usually a bony area at the base of the spinal column. Additional steps may be done to repair a prolapsed rectum, bladder, or other tissues. When the surgery is done, the tools will be removed. The incisions will be closed and bandaged. What happens after a robotic-assisted sacrocolpopexy? After the surgery, your vital signs will be watched. You may need to stay overnight in the hospital. When you're ready to go home, you'll need to have someone drive you.  You may have some pain after the surgery. You can have pain medicine as needed. You can resume a normal diet as soon as  you are able to.  Moving around as soon as possible after surgery can help prevent problems such as blood clots. You may need to do breathing therapy to help expand your lungs after surgery.  You may also have some fluid leaking from the incisions. Tell your provider if there is a lot of fluid, or the incisions are red or warm. Call your provider right away if you have a fever, heavy bleeding, severe pain, trouble breathing, or other severe symptoms.  Follow all of your provider's instructions about wound care and medicines. Limit your movement and sexual activity as advised. You might need to have stitches removed at a follow-up appointment. Make sure to go to all of your follow-up  appointments.  Your prolapse symptoms may go away completely after surgery. Talk with your provider if your symptoms do not go away, or if they return.  Next steps Before you agree to the test or the procedure make sure you know:  The name of the test or procedure The reason you are having the test or procedure What results to expect and what they mean The risks and benefits of the test or procedure What the possible side effects or complications are When and where you are to have the test or procedure Who will do the test or procedure and what that person's qualifications are What would happen if you did not have the test or procedure Any alternative tests or procedures to think about When and how will you get the results Who to call after the test or procedure if you have questions or problems How much will you have to pay for the test or procedure

## 2021-10-13 NOTE — Telephone Encounter (Signed)
Pt was at check out today and wanted to know who is going to be calling her from [facility] for her upcoming procedure.  Please advise pt [phone]

## 2021-10-14 LAB — URINALYSIS, COMPLETE
Bilirubin, UA: NEGATIVE
Glucose, UA: NEGATIVE
Ketones, UA: NEGATIVE
Nitrite, UA: NEGATIVE
Specific Gravity, UA: 1.015 (ref 1.005–1.030)
Urobilinogen, Ur: 0.2 mg/dL (ref 0.2–1.0)
pH, UA: 7 (ref 5.0–7.5)

## 2021-10-14 LAB — MICROSCOPIC EXAMINATION: WBC, UA: >30 /hpf — AB (ref 0–5)

## 2021-10-14 NOTE — Telephone Encounter (Signed)
Patient was advised that the office we referred her to would contact her.

## 2022-06-14 ENCOUNTER — Other Ambulatory Visit: Payer: Self-pay | Admitting: Internal Medicine

## 2022-06-14 DIAGNOSIS — R0989 Other specified symptoms and signs involving the circulatory and respiratory systems: Secondary | ICD-10-CM

## 2022-06-22 ENCOUNTER — Ambulatory Visit: Payer: Medicare PPO

## 2022-07-01 ENCOUNTER — Ambulatory Visit
Admission: RE | Admit: 2022-07-01 | Discharge: 2022-07-01 | Disposition: A | Discharge status: Home / Self Care | Payer: Medicare PPO | Source: Ambulatory Visit | Attending: Internal Medicine | Admitting: Internal Medicine

## 2022-07-01 DIAGNOSIS — R0989 Other specified symptoms and signs involving the circulatory and respiratory systems: Secondary | ICD-10-CM | POA: Diagnosis not present

## 2023-08-09 ENCOUNTER — Ambulatory Visit: Admitting: Pediatrics

## 2023-08-09 ENCOUNTER — Encounter: Payer: Self-pay | Admitting: Pediatrics

## 2023-08-09 VITALS — BP 129/84 | HR 84 | Temp 97.8°F | Ht 63.0 in | Wt 160.0 lb

## 2023-08-09 DIAGNOSIS — N3281 Overactive bladder: Secondary | ICD-10-CM | POA: Diagnosis not present

## 2023-08-09 DIAGNOSIS — Z7689 Persons encountering health services in other specified circumstances: Secondary | ICD-10-CM

## 2023-08-09 DIAGNOSIS — I10 Essential (primary) hypertension: Secondary | ICD-10-CM | POA: Diagnosis not present

## 2023-08-09 DIAGNOSIS — Z1330 Encounter for screening examination for mental health and behavioral disorders, unspecified: Secondary | ICD-10-CM

## 2023-08-09 MED ORDER — MIRABEGRON ER 50 MG PO TB24: 50.0000 mg | ORAL_TABLET | Freq: Every day | ORAL | 2 refills | Status: DC

## 2023-08-09 NOTE — Patient Instructions (Signed)

## 2023-08-09 NOTE — Assessment & Plan Note (Signed)
 Hypertension is well-controlled with rampril 10mg .

## 2023-08-09 NOTE — Assessment & Plan Note (Signed)
 Chronic overactive bladder persists with the current medication at the lowest dose proving ineffective. She experiences frequent urination without leakage. The medication is costly and not fully covered by insurance, though an initial higher dose was effective. Increase the dose of the current medication and continue the bladder support supplement. Reassess in four weeks. Monitor for UTI signs and contact the office if symptoms arise. Consider alternative medications if the higher dose is ineffective or cost remains a concern.

## 2023-08-09 NOTE — Progress Notes (Signed)
 Establish Care Note  BP 129/84   Pulse 84   Temp 97.8 F (36.6 C) (Oral)   Ht 5' 3 (1.6 m)   Wt 160 lb (72.6 kg)   SpO2 97%   BMI 28.34 kg/m    Subjective:    Patient ID: Olivia [name], female    DOB: [birth year], 89 y.o.   MRN: [id number]  HPI: Olivia [name] is a 89 y.o. female  Chief Complaint  Patient presents with   Establish Care    Incontinence medications isn't currently working      Establishing care, the following was discussed today:  Discussed the use of AI scribe software for clinical note transcription with the patient, who gave verbal consent to proceed.  History of Present Illness   Olivia [name] is a 89 year old female with urinary incontinence who presents with ineffective incontinence medication.  Her current incontinence medication, which she has been on for approximately five months, initially worked well but has since lost its efficacy. She experiences frequent urination but does not have issues with leakage. She tried an additional supplement called 'Ultimate Bladder Support' based on a friend's recommendation, but it has not provided relief.  She has a history of urinary tract infections (UTIs) but did not experience one when she started her current medication. However, she had a UTI about two months ago.  Her current medications include a low dose of the incontinence medication, B12 for energy, and a blood pressure medication. She also takes Tylenol  PM at night, which does not make her sleepy.  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 Her father was a Teacher, early years/pre who lived to be eighty-seven, and she has a family history of diabetes, though she herself has not been diagnosed with it.  She reports some shortness of breath with exertion but not at rest, and does not report palpitations.        Current Outpatient Medications on File Prior to Visit  Medication Sig Dispense  Refill   acetaminophen  (TYLENOL ) 500 MG tablet Take 500 mg by mouth every 6 (six) hours as needed for pain.     aspirin  EC 81 MG EC tablet Take 1 tablet (81 mg total) by mouth daily. Swallow whole. 30 tablet 0   atorvastatin  (LIPITOR) 10 MG tablet Take 10 mg by mouth daily.     calcium  carbonate (OS-CAL - DOSED IN MG OF ELEMENTAL CALCIUM ) 1250 (500 Ca) MG tablet Take 1 tablet by mouth.     Cranberry 400 MG CAPS Take by mouth.     dorzolamide-timolol  (COSOPT) 22.3-6.8 MG/ML ophthalmic solution SMARTSIG:In Eye(s)     latanoprost  (XALATAN ) 0.005 % ophthalmic solution SMARTSIG:In Eye(s)     Multiple Vitamin (MULTIVITAMIN) capsule Take 1 capsule by mouth daily.     prednisoLONE acetate (PRED FORTE) 1 % ophthalmic suspension 1 drop 4 (four) times daily.     ramipril  (ALTACE ) 10 MG capsule Take 1 capsule (10 mg total) by mouth every evening. 30 capsule 0   Travoprost, BAK Free, (TRAVATAN) 0.004 % SOLN ophthalmic solution 1 drop at bedtime.     No current facility-administered medications on file prior to visit.    #HM Will review HM records and updated as needed.  Relevant past medical, surgical, family and social history reviewed and updated as indicated. Interim medical history since our last visit reviewed. Allergies and medications reviewed and updated.  ROS per HPI unless specifically indicated  above     Objective:    BP 129/84   Pulse 84   Temp 97.8 F (36.6 C) (Oral)   Ht 5' 3 (1.6 m)   Wt 160 lb (72.6 kg)   SpO2 97%   BMI 28.34 kg/m   Wt Readings from Last 3 Encounters:  08/09/23 160 lb (72.6 kg)  10/13/21 161 lb (73 kg)  09/15/21 165 lb (74.8 kg)     Physical Exam Constitutional:      Appearance: Normal appearance.  Pulmonary:     Effort: Pulmonary effort is normal.  Musculoskeletal:        General: Normal range of motion.  Skin:    Comments: Normal skin color  Neurological:     General: No focal deficit present.     Mental Status: She is alert. Mental status  is at baseline.  Psychiatric:        Mood and Affect: Mood normal.        Behavior: Behavior normal.        Thought Content: Thought content normal.         08/09/2023    8:51 AM  Depression screen PHQ 2/9  Decreased Interest 0  Down, Depressed, Hopeless 1  PHQ - 2 Score 1  Altered sleeping 0  Tired, decreased energy 2  Change in appetite 2  Feeling bad or failure about yourself  0  Trouble concentrating 2  Moving slowly or fidgety/restless 0  Suicidal thoughts 0  PHQ-9 Score 7  Difficult doing work/chores Not difficult at [name]        08/09/2023    8:52 AM  GAD 7 : Generalized Anxiety Score  Nervous, Anxious, on Edge 1  Control/stop worrying 1  Worry too much - different things 0  Trouble relaxing 0  Restless 0  Easily annoyed or irritable 0  Afraid - awful might happen 0  Total GAD 7 Score 2  Anxiety Difficulty Somewhat difficult       Assessment & Plan:  Assessment & Plan   Overactive bladder Assessment & Plan: Chronic overactive bladder persists with the current medication at the lowest dose proving ineffective. She experiences frequent urination without leakage. The medication is costly and not fully covered by insurance, though an initial higher dose was effective. Increase the dose of the current medication and continue the bladder support supplement. Reassess in four weeks. Monitor for UTI signs and contact the office if symptoms arise. Consider alternative medications if the higher dose is ineffective or cost remains a concern.  Orders: -     Mirabegron  ER; Take 1 tablet (50 mg total) by mouth daily.  Dispense: 30 tablet; Refill: 2  Primary hypertension Assessment & Plan: Hypertension is well-controlled with rampril 10mg .   Encounter to establish care Reviewed available patient record including history, medications, problem list. HM updated as able. Will review and/or request outside records (if applicable) and will fill remaining HM gaps as needed at  follow up visit.  Encounter for behavioral health screening As part of their intake evaluation, the patient was screened for depression, anxiety.  PHQ9 SCORE 7, GAD7 SCORE 2. Screening results negative for tested conditions. Some grief sx due to loss of husband, CTM.   Follow up plan: Return in about 4 weeks (around 09/06/2023) for overactive bladder.  Olivia SHAUNNA Nett, MD

## 2023-09-06 ENCOUNTER — Ambulatory Visit: Admitting: Pediatrics

## 2023-09-06 ENCOUNTER — Encounter: Payer: Self-pay | Admitting: Pediatrics

## 2023-09-06 VITALS — BP 128/83 | HR 91 | Temp 97.6°F | Wt 157.4 lb

## 2023-09-06 DIAGNOSIS — N3281 Overactive bladder: Secondary | ICD-10-CM

## 2023-09-06 DIAGNOSIS — R399 Unspecified symptoms and signs involving the genitourinary system: Secondary | ICD-10-CM

## 2023-09-06 DIAGNOSIS — R0602 Shortness of breath: Secondary | ICD-10-CM | POA: Diagnosis not present

## 2023-09-06 MED ORDER — TROSPIUM CHLORIDE 20 MG PO TABS: 20.0000 mg | ORAL_TABLET | Freq: Every day | ORAL | 1 refills | Status: DC

## 2023-09-06 NOTE — Progress Notes (Signed)
 Office Visit  BP 128/83   Pulse 91   Temp 97.6 F (36.4 C) (Oral)   Wt 157 lb 6.4 oz (71.4 kg)   SpO2 96%   BMI 27.88 kg/m    Subjective:    Patient ID: Olivia [name], female    DOB: [birth year], 89 y.o.   MRN: [id number]  HPI: Olivia [name] is a 89 y.o. female  Chief Complaint  Patient presents with   Shortness of Breath    Discussed the use of AI scribe software for clinical note transcription with the patient, who gave verbal consent to proceed.  History of Present Illness   Olivia [name] is a 89 year old female who presents with urinary incontinence and shortness of breath.  She experiences urinary incontinence primarily during the day, with difficulty holding urine from lunchtime until bedtime. She can usually make it to the bathroom if she goes quickly, but experiences wetting after breakfast. At night, she wakes up dry and typically gets up at least once to urinate. No symptoms of a urinary tract infection, although she has had them in the past. She is currently on a medication for her bladder, with about a week left of her current supply.  She has new onset shortness of breath, described as a sensation of needing to breathe again. This occurs with activity, such as getting ready for a shower or using her walker, and resolves with rest or sleep. No chest pain, heart racing, or cough. She mentions having a lot of drainage due to allergies, which she experiences annually. No fever or pneumonia following her allergies. She recalls having a stress test and other cardiac evaluations several years ago, which were normal. She has not been a smoker and does not have a current cardiologist appointment as she was previously dismissed due to good health.  Her current medications include a blood pressure medication, which she has been on since a previous episode of elevated blood pressure that required hospitalization a couple of years ago. She uses a walker and  occasionally a cane for mobility.      Relevant past medical, surgical, family and social history reviewed and updated as indicated. Interim medical history since our last visit reviewed. Allergies and medications reviewed and updated.  ROS per HPI unless specifically indicated above     Objective:    BP 128/83   Pulse 91   Temp 97.6 F (36.4 C) (Oral)   Wt 157 lb 6.4 oz (71.4 kg)   SpO2 96%   BMI 27.88 kg/m   Wt Readings from Last 3 Encounters:  09/06/23 157 lb 6.4 oz (71.4 kg)  08/09/23 160 lb (72.6 kg)  10/13/21 161 lb (73 kg)     Physical Exam Constitutional:      Appearance: Normal appearance.  Pulmonary:     Effort: Pulmonary effort is normal.  Musculoskeletal:        General: Normal range of motion.  Skin:    Comments: Normal skin color  Neurological:     General: No focal deficit present.     Mental Status: She is alert. Mental status is at baseline.  Psychiatric:        Mood and Affect: Mood normal.        Behavior: Behavior normal.        Thought Content: Thought content normal.         09/06/2023    9:42 AM 08/09/2023    8:51 AM  Depression screen PHQ  2/9  Decreased Interest 0 0  Down, Depressed, Hopeless 1 1  PHQ - 2 Score 1 1  Altered sleeping 0 0  Tired, decreased energy 0 2  Change in appetite 0 2  Feeling bad or failure about yourself  0 0  Trouble concentrating 0 2  Moving slowly or fidgety/restless 0 0  Suicidal thoughts 0 0  PHQ-9 Score 1 7  Difficult doing work/chores Not difficult at [name] Not difficult at [name]       09/06/2023    9:42 AM 08/09/2023    8:52 AM  GAD 7 : Generalized Anxiety Score  Nervous, Anxious, on Edge 0 1  Control/stop worrying 0 1  Worry too much - different things 0 0  Trouble relaxing 0 0  Restless 0 0  Easily annoyed or irritable 0 0  Afraid - awful might happen 0 0  Total GAD 7 Score 0 2  Anxiety Difficulty Not difficult at [name] Somewhat difficult       Assessment & Plan:  Assessment & Plan    Overactive bladder Urinary tract infection symptoms Urinary incontinence with daytime urgency, nocturnal dryness. Current medication may increase UTI risk. Decision to switch to Sanctura  due to lower cognitive risk in older adults. - Discontinue current bladder medication. - Prescribe Sanctura  (trospium ) at low dose, taken at night. - Collect urine sample to rule out UTI. - Follow up via phone in 1-2 weeks to assess medication tolerance. -     Trospium  Chloride; Take 1 tablet (20 mg total) by mouth at bedtime.  Dispense: 30 tablet; Refill: 1 -     Urinalysis, Routine w reflex microscopic -     Urine Culture  Shortness of breath New onset shortness of breath on exertion after medication increase. Symptoms resolve with rest. Normal stress test in the past. Patient prefers minimal workup and declined EKG, chest xray, or referrals for repeat stress testing for now. HD stable. Decision to stop medication to assess symptom resolution. - Discontinue recent medication increase. - Monitor symptoms after medication discontinuation. - Reassess if symptoms persist or worsen.     Follow up plan: Return in about 3 months (around 12/18/2023).  Hadassah SHAUNNA Nett, MD

## 2023-09-06 NOTE — Patient Instructions (Addendum)
 Stop mirabregron 50mg , start trospium  20mg  nightly

## 2023-09-08 ENCOUNTER — Other Ambulatory Visit: Payer: Self-pay | Admitting: Pediatrics

## 2023-09-08 DIAGNOSIS — N3000 Acute cystitis without hematuria: Secondary | ICD-10-CM

## 2023-09-08 LAB — URINALYSIS, ROUTINE W REFLEX MICROSCOPIC
Bilirubin, UA: NEGATIVE
Glucose, UA: NEGATIVE
Ketones, UA: NEGATIVE
Nitrite, UA: POSITIVE — AB
Protein,UA: NEGATIVE
RBC, UA: NEGATIVE
Specific Gravity, UA: 1.015 (ref 1.005–1.030)
Urobilinogen, Ur: 0.2 mg/dL (ref 0.2–1.0)
pH, UA: 6.5 (ref 5.0–7.5)

## 2023-09-08 LAB — MICROSCOPIC EXAMINATION

## 2023-09-08 MED ORDER — NITROFURANTOIN MONOHYD MACRO 100 MG PO CAPS: 100.0000 mg | ORAL_CAPSULE | Freq: Two times a day (BID) | ORAL | 0 refills | Status: AC

## 2023-09-08 NOTE — Addendum Note (Signed)
 Addended by: FELICIANO BOLT E on: 09/08/2023 11:44 AM   Modules accepted: Orders

## 2023-09-08 NOTE — Progress Notes (Signed)
 U/A results: Nit pos Leu +3  Culture pending  Will send macrobid   Hadassah SHAUNNA Nett, MD

## 2023-09-12 ENCOUNTER — Ambulatory Visit: Payer: Self-pay | Admitting: Pediatrics

## 2023-09-12 LAB — URINE CULTURE

## 2023-09-21 ENCOUNTER — Telehealth: Payer: Self-pay

## 2023-09-21 NOTE — Telephone Encounter (Signed)
 Copied from CRM #[id number]. Topic: Clinical - Prescription Issue >> Sep 21, 2023  2:10 PM Emylou G wrote: Reason for CRM: Patient called trospium  (SANCTURA ) 20 MG tablet .. says it isn't working?   maybe she isn't taking it correctly?  like empty stomach or before bed etc?  Pls call her asap to go over

## 2023-09-27 NOTE — Telephone Encounter (Signed)
 Called and informed patient.

## 2023-10-20 ENCOUNTER — Other Ambulatory Visit: Payer: Self-pay | Admitting: Pediatrics

## 2023-10-20 DIAGNOSIS — N3281 Overactive bladder: Secondary | ICD-10-CM

## 2023-10-23 NOTE — Telephone Encounter (Signed)
 Requested medications are due for refill today.  yes  Requested medications are on the active medications list.  yes  Last refill. 09/06/2023 #30 1 rf  Future visit scheduled.   yes  Notes to clinic.  New medication to this pt.    Requested Prescriptions  Pending Prescriptions Disp Refills   trospium  (SANCTURA ) 20 MG tablet [Pharmacy Med Name: TROSPIUM  CHLORIDE 20 MG TAB] 30 tablet 1    Sig: TAKE 1 TABLET BY MOUTH AT BEDTIME     Urology:  Bladder Agents - trospium  chloride Failed - 10/23/2023  4:06 PM      Failed - Cr in normal range and within 360 days    Creatinine  Date Value Ref Range Status  09/05/2011 0.81 0.60 - 1.30 mg/dL Final   Creatinine, Ser  Date Value Ref Range Status  01/30/2021 0.76 0.44 - 1.00 mg/dL Final         Failed - eGFR is 30 or above and within 360 days    EGFR (African American)  Date Value Ref Range Status  09/05/2011 >60  Final   EGFR (Non-African Amer.)  Date Value Ref Range Status  09/05/2011 >60  Final    Comment:    eGFR values <68mL/min/1.73 m2 may be an indication of chronic kidney disease (CKD). Calculated eGFR is useful in patients with stable renal function. The eGFR calculation will not be reliable in acutely ill patients when serum creatinine is changing rapidly. It is not useful in  patients on dialysis. The eGFR calculation may not be applicable to patients at the low and high extremes of body sizes, pregnant women, and vegetarians.    GFR, Estimated  Date Value Ref Range Status  01/30/2021 >60 >60 mL/min Final    Comment:    (NOTE) Calculated using the CKD-EPI Creatinine Equation (2021)          Passed - Valid encounter within last 12 months    Recent Outpatient Visits           1 month ago Overactive bladder   [place] [facility] Hadassah SQUIBB, MD   2 months ago Overactive bladder   [place] [facility] Hadassah SQUIBB, MD

## 2023-10-24 ENCOUNTER — Telehealth: Payer: Self-pay

## 2023-10-24 NOTE — Telephone Encounter (Signed)
 Copied from CRM [id number]. Topic: Clinical - Medication Question >> Oct 24, 2023 10:17 AM Olivia [name] wrote: Reason for CRM: Pt is taking trospium  (SANCTURA ) 20 MG tablet for incontinence. She is stating she does not want to take medication and is wanting advice regarding medication.   Please advice [phone]

## 2023-11-03 NOTE — Telephone Encounter (Signed)
 Attempted to reach patient unsuccessful will attempt next business day okay for E2C2 to go over with pt

## 2023-11-07 NOTE — Telephone Encounter (Signed)
 Unsuccessful attempts with trying to reach patient will close this encounter

## 2023-11-17 ENCOUNTER — Encounter: Payer: Self-pay | Admitting: Pediatrics

## 2023-12-12 ENCOUNTER — Encounter: Payer: Self-pay | Admitting: Pediatrics

## 2023-12-12 ENCOUNTER — Ambulatory Visit: Admitting: Pediatrics

## 2023-12-12 VITALS — BP 120/77 | HR 113 | Temp 97.6°F | Ht 63.0 in | Wt 160.4 lb

## 2023-12-12 DIAGNOSIS — I10 Essential (primary) hypertension: Secondary | ICD-10-CM

## 2023-12-12 DIAGNOSIS — N3281 Overactive bladder: Secondary | ICD-10-CM | POA: Diagnosis not present

## 2023-12-12 DIAGNOSIS — I4891 Unspecified atrial fibrillation: Secondary | ICD-10-CM | POA: Insufficient documentation

## 2023-12-12 MED ORDER — RAMIPRIL 10 MG PO CAPS: 10.0000 mg | ORAL_CAPSULE | Freq: Every evening | ORAL | 1 refills | Status: AC

## 2023-12-12 NOTE — Assessment & Plan Note (Addendum)
 New onset, diagnosed today from EKG w/o RVR. Irregular rhythm on exam. CHA?DS?-VASc score 3-4 (unclear if had true TIA or stroke in the past), anticoagulation recommended but HASBLED score 2 so has moderate risk for major bleeding. Plan to have her see cardiology to discuss further.

## 2023-12-12 NOTE — Assessment & Plan Note (Signed)
 Well controlled on rampiril 10mg . Sending refills.

## 2023-12-12 NOTE — Progress Notes (Signed)
 Office Visit  BP 120/77   Pulse (!) 113   Temp 97.6 F (36.4 C) (Oral)   Ht 5' 3 (1.6 m)   Wt 160 lb 6.4 oz (72.8 kg)   SpO2 97%   BMI 28.41 kg/m    Subjective:    Patient ID: Olivia [name] All, female    DOB: 28-Oct-1931, 89 y.o.   MRN: [id number]  HPI: Olivia [name] is a 89 y.o. female  Chief Complaint  Patient presents with   Hyperlipidemia   Hypertension    Discussed the use of AI scribe software for clinical note transcription with the patient, who gave verbal consent to proceed.  History of Present Illness   Olivia [name] is a 89 year old female who presents with concerns about her heart rate and medication management.  She denies palpitations and shortness of breath. She acknowledges inadequate hydration.  She recently discontinued a medication she felt was ineffective and possibly related to a urinary tract infection. Since stopping the medication, she feels better and has not experienced further UTIs.  She has refilled her blood pressure medication and currently has a 90-day supply, confirming sufficient refills for her current medications.      Relevant past medical, surgical, family and social history reviewed and updated as indicated. Interim medical history since our last visit reviewed. Allergies and medications reviewed and updated.  ROS per HPI unless specifically indicated above     Objective:    BP 120/77   Pulse (!) 113   Temp 97.6 F (36.4 C) (Oral)   Ht 5' 3 (1.6 m)   Wt 160 lb 6.4 oz (72.8 kg)   SpO2 97%   BMI 28.41 kg/m   Wt Readings from Last 3 Encounters:  12/12/23 160 lb 6.4 oz (72.8 kg)  09/06/23 157 lb 6.4 oz (71.4 kg)  08/09/23 160 lb (72.6 kg)     Physical Exam Constitutional:      Appearance: Normal appearance.  Cardiovascular:     Rate and Rhythm: Normal rate. Rhythm irregular.     Pulses: Normal pulses.     Heart sounds: Normal heart sounds.  Pulmonary:     Effort: Pulmonary effort is normal.      Breath sounds: Normal breath sounds.  Musculoskeletal:        General: Normal range of motion.  Skin:    Comments: Normal skin color  Neurological:     General: No focal deficit present.     Mental Status: She is alert. Mental status is at baseline.  Psychiatric:        Mood and Affect: Mood normal.        Behavior: Behavior normal.        Thought Content: Thought content normal.         12/12/2023   10:40 AM 09/06/2023    9:42 AM 08/09/2023    8:51 AM  Depression screen PHQ 2/9  Decreased Interest 1 0 0  Down, Depressed, Hopeless 0 1 1  PHQ - 2 Score 1 1 1   Altered sleeping 0 0 0  Tired, decreased energy 2 0 2  Change in appetite 0 0 2  Feeling bad or failure about yourself  0 0 0  Trouble concentrating 0 0 2  Moving slowly or fidgety/restless 0 0 0  Suicidal thoughts 0 0 0  PHQ-9 Score 3 1  7    Difficult doing work/chores Not difficult at all Not difficult at all Not difficult at all  Data saved with a previous flowsheet row definition       12/12/2023   10:41 AM 09/06/2023    9:42 AM 08/09/2023    8:52 AM  GAD 7 : Generalized Anxiety Score  Nervous, Anxious, on Edge 0 0 1  Control/stop worrying 1 0 1  Worry too much - different things 0 0 0  Trouble relaxing 0 0 0  Restless 0 0 0  Easily annoyed or irritable 1 0 0  Afraid - awful might happen 0 0 0  Total GAD 7 Score 2 0 2  Anxiety Difficulty Not difficult at all Not difficult at all Somewhat difficult   EKG my read: AFIB W vent rate 95     Assessment & Plan:  Assessment & Plan   Atrial fibrillation, new onset [facility]) Assessment & Plan: New onset, diagnosed today from EKG w/o RVR. Irregular rhythm on exam. CHA?DS?-VASc score 3-4 (unclear if had true TIA or stroke in the past), anticoagulation recommended but HASBLED score 2 so has moderate risk for major bleeding. Plan to have her see cardiology to discuss further.   Orders: -     EKG 12-Lead -     Ambulatory referral to Cardiology  Primary  hypertension Assessment & Plan: Well controlled on rampiril 10mg . Sending refills.  Orders: -     Ramipril ; Take 1 capsule (10 mg total) by mouth every evening.  Dispense: 90 capsule; Refill: 1 -     CBC with Differential/Platelet -     Basic metabolic panel with GFR -     Magnesium  Overactive bladder Assessment & Plan: Improved since tx for UTI. Not taking any meds. CTM.     Follow up plan: Return in about 3 months (around 03/11/2024).  Hadassah SHAUNNA Nett, MD

## 2023-12-12 NOTE — Assessment & Plan Note (Signed)
 Improved since tx for UTI. Not taking any meds. CTM.

## 2023-12-13 ENCOUNTER — Ambulatory Visit: Payer: Self-pay | Admitting: Pediatrics

## 2023-12-13 ENCOUNTER — Ambulatory Visit: Admitting: Pediatrics

## 2023-12-13 LAB — BASIC METABOLIC PANEL WITH GFR
BUN/Creatinine Ratio: 25 (ref 12–28)
BUN: 17 mg/dL (ref 10–36)
CO2: 22 mmol/L (ref 20–29)
Calcium: 10 mg/dL (ref 8.7–10.3)
Chloride: 102 mmol/L (ref 96–106)
Creatinine, Ser: 0.68 mg/dL (ref 0.57–1.00)
Glucose: 94 mg/dL (ref 70–99)
Potassium: 4.4 mmol/L (ref 3.5–5.2)
Sodium: 136 mmol/L (ref 134–144)
eGFR: 83 mL/min/1.73 (ref >59)

## 2023-12-13 LAB — CBC WITH DIFFERENTIAL/PLATELET
Basophils Absolute: 0.1 x10E3/uL (ref 0.0–0.2)
Basos: 1 %
EOS (ABSOLUTE): 0.2 x10E3/uL (ref 0.0–0.4)
Eos: 3 %
Hematocrit: 39.1 % (ref 34.0–46.6)
Hemoglobin: 12.8 g/dL (ref 11.1–15.9)
Immature Grans (Abs): 0 x10E3/uL (ref 0.0–0.1)
Immature Granulocytes: 0 %
Lymphocytes Absolute: 2.6 x10E3/uL (ref 0.7–3.1)
Lymphs: 31 %
MCH: 29 pg (ref 26.6–33.0)
MCHC: 32.7 g/dL (ref 31.5–35.7)
MCV: 89 fL (ref 79–97)
Monocytes Absolute: 0.9 x10E3/uL (ref 0.1–0.9)
Monocytes: 10 %
Neutrophils Absolute: 4.7 x10E3/uL (ref 1.4–7.0)
Neutrophils: 55 %
Platelets: 278 x10E3/uL (ref 150–450)
RBC: 4.42 x10E6/uL (ref 3.77–5.28)
RDW: 13.3 % (ref 11.7–15.4)
WBC: 8.4 x10E3/uL (ref 3.4–10.8)

## 2023-12-13 LAB — MAGNESIUM: Magnesium: 2 mg/dL (ref 1.6–2.3)

## 2023-12-15 ENCOUNTER — Ambulatory Visit: Admitting: Pediatrics

## 2023-12-20 ENCOUNTER — Telehealth: Payer: Self-pay | Admitting: Pediatrics

## 2023-12-20 NOTE — Telephone Encounter (Signed)
 Forms received. Will start and give to provider to complete and sign.

## 2023-12-20 NOTE — Telephone Encounter (Signed)
 Copied from CRM #[id number]. Topic: General - Other >> Dec 20, 2023 10:55 AM [name] wrote: Reason for CRM: [employer] form needs to be filled out before January 27th from PCP. Its an annuity form. Spoke with CAL and they advised pt can drop off paperwork. Pt stated she will do that

## 2023-12-20 NOTE — Telephone Encounter (Deleted)
 Noted will complete when paperwork when dropped off by patient.

## 2023-12-20 NOTE — Telephone Encounter (Signed)
 Patient dropped off Provider Recertification Form to be filled out by provider. Patient is requesting a call back at [phone] within 2-5 days when completed. Document is located in providers folder.

## 2023-12-28 NOTE — Telephone Encounter (Signed)
 Copied from CRM #[id number]. Topic: General - Other >> Dec 20, 2023 10:55 AM [name] wrote: Reason for CRM: [employer] form needs to be filled out before January 27th from PCP. Its an annuity form. Spoke with [employer] and they advised pt can drop off paperwork. Pt stated she will do that >> Dec 28, 2023 11:12 AM [name] wrote: Patient calling for status update on the annuity forms. Insurance need to receive them by January 27th, 2026   Read Note Verbatim:  Olivia [name], [facility]  Certified Medical Assistant   Telephone Encounter Signed  Encounter Date: 12/20/2023 Signed  Forms received. Will start and give to provider to complete and sign.    Patient would like a call back regarding the papers.

## 2023-12-28 NOTE — Telephone Encounter (Signed)
 Paperwork pending provider review and signature.

## 2024-01-02 NOTE — Telephone Encounter (Signed)
 Paperwork has been sent back for the patient. Copy placed in scan bin and originals placed in bin for patient to pick up.   Called and notified patient that her forms were ready to be picked up and that we faxed them in for her as well.

## 2024-03-14 ENCOUNTER — Ambulatory Visit: Admitting: Family Medicine
